# Patient Record
Sex: Female | Born: 1990 | Race: White | Hispanic: No | Marital: Single | Smoking: Never smoker
Health system: Southern US, Community
[De-identification: ages and names within clinical notes are randomized; demographics above are authoritative.]

## PROBLEM LIST (undated history)

## (undated) DIAGNOSIS — I499 Cardiac arrhythmia, unspecified: Secondary | ICD-10-CM

## (undated) HISTORY — PX: LEEP: SHX91

## (undated) HISTORY — PX: FRACTURE SURGERY: SHX138

---
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
## 2009-01-05 IMAGING — CR DG NECK SOFT TISSUE
1 series · 1 of 1 positions shown · non-contrast
Comparison: none

CLINICAL DATA: Neck pain

NECK SOFT TISSUES - 1  VIEW:

[w soft tissue neck *]
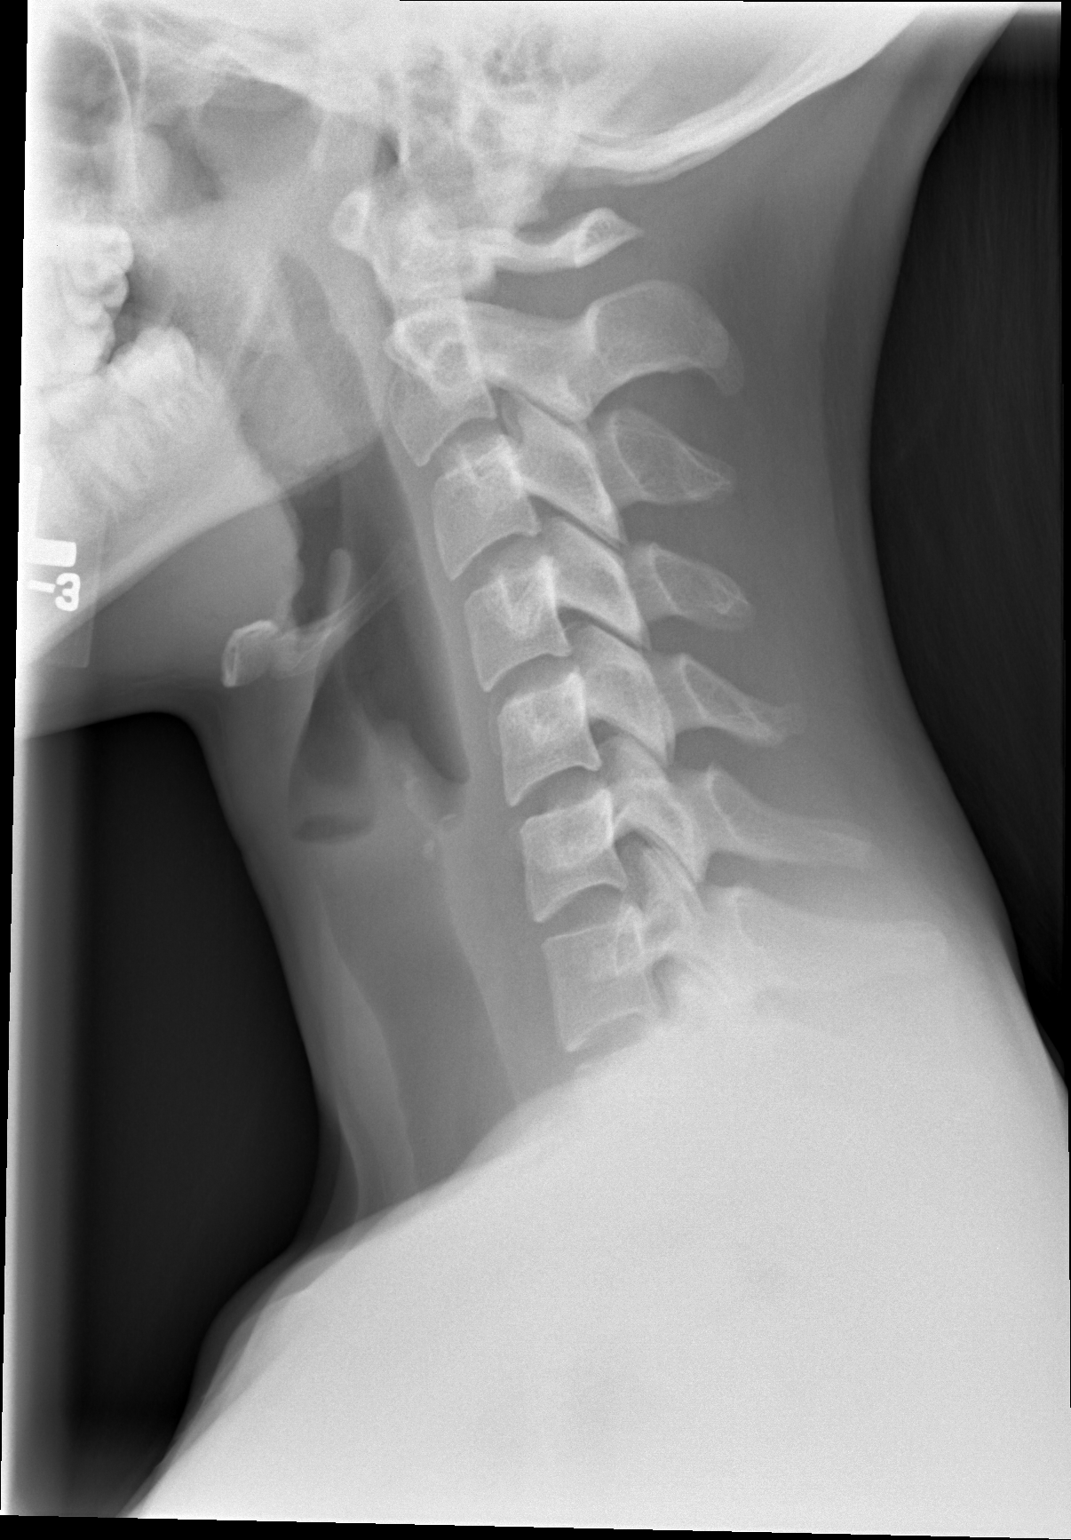

[1 of 1 positions shown; findings below may reference images not displayed]

FINDINGS: There is no evidence of retropharyngeal soft tissue swelling or
epiglottic enlargement.  The cervical airway is unremarkable, and no
radio-opaque foreign body identified. Visualized bony structures unremarkable.
IMPRESSION: Negative.

## 2014-01-23 ENCOUNTER — Ambulatory Visit (INDEPENDENT_AMBULATORY_CARE_PROVIDER_SITE_OTHER): Payer: BC Managed Care – PPO

## 2014-01-23 ENCOUNTER — Ambulatory Visit (INDEPENDENT_AMBULATORY_CARE_PROVIDER_SITE_OTHER): Payer: BC Managed Care – PPO | Admitting: Emergency Medicine

## 2014-01-23 VITALS — BP 112/78 | HR 65 | Temp 98.6°F | Resp 16 | Ht 67.75 in | Wt 126.8 lb

## 2014-01-23 DIAGNOSIS — M546 Pain in thoracic spine: Secondary | ICD-10-CM

## 2014-01-23 DIAGNOSIS — M542 Cervicalgia: Secondary | ICD-10-CM

## 2014-01-23 MED ORDER — CYCLOBENZAPRINE HCL 5 MG PO TABS: 5.0000 mg | ORAL_TABLET | Freq: Three times a day (TID) | ORAL | Status: DC | PRN

## 2014-01-23 MED ORDER — HYDROCODONE-ACETAMINOPHEN 5-325 MG PO TABS: ORAL_TABLET | ORAL | Status: DC

## 2014-01-23 NOTE — Patient Instructions (Signed)

## 2014-01-23 NOTE — Progress Notes (Signed)
   Subjective:    Patient ID: Sylvia Holden, female    DOB: 1990-05-25, 23 y.o.   MRN: [id number]  HPI patient has a long history of neck and back problems. She has not been to the doctor for these. Last Saturday she developed increasing pain in her neck and upper shoulders. She started on ibuprofen 800 mg every 6 hours last night but has had progressive worsening pain. She denies any paresthesias in the arms or legs and does not have any weakness. 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    Review of Systems     Objective:   Physical Exam  Constitutional:  Patient is sitting on exam table holding herself upright because of pain in her neck and upper back  HENT:  Head: Normocephalic and atraumatic.  Eyes: Pupils are equal, round, and reactive to light.  Neck: No JVD present. No tracheal deviation present. No thyromegaly present.  There is tenderness in the paracervical and upper perithoracic muscles   Cardiovascular: Normal rate and regular rhythm.   Pulmonary/Chest: Effort normal and breath sounds normal. No stridor.  Abdominal: Soft. Bowel sounds are normal.   [facility] reading (PRIMARY) by  Dr.Labrenda Lasky C-spine films show a vertical line through the odontoid consistent with a space in her upper incisors. There is no other acute abnormalities except for straightening of the C-spine. Chest x-ray shows no pneumothorax heart and lungs appear normal. T-spine films show a mild scoliotic curve but otherwise unremarkable  Meds ordered this encounter  Medications  . etonogestrel-ethinyl estradiol (NUVARING) 0.12-0.015 MG/24HR vaginal ring    Sig: Place 1 each vaginally every 28 (twenty-eight) days. Insert vaginally and leave in place for 3 consecutive weeks, then remove for 1 week.  . cyclobenzaprine (FLEXERIL) 5 MG tablet    Sig: Take 1 tablet (5 mg total) by mouth 3 (three) times daily as needed for muscle spasms.    Dispense:  30 tablet    Refill:  1  .  HYDROcodone-acetaminophen (NORCO) 5-325 MG per tablet    Sig: One half to one tablet every 6-8 hours as needed for severe pain    Dispense:  16 tablet    Refill:  0        Assessment & Plan:  She will continue over-the-counter ibuprofen. She was given Flexeril and a limited supply of hydrocodone. She will also start some heat and massage to the area

## 2014-10-20 LAB — OB RESULTS CONSOLE HEPATITIS B SURFACE ANTIGEN: Hepatitis B Surface Ag: NEGATIVE

## 2014-10-20 LAB — OB RESULTS CONSOLE GC/CHLAMYDIA
CHLAMYDIA, DNA PROBE: NEGATIVE
Gonorrhea: NEGATIVE

## 2014-10-20 LAB — OB RESULTS CONSOLE ANTIBODY SCREEN: ANTIBODY SCREEN: NEGATIVE

## 2014-10-20 LAB — OB RESULTS CONSOLE RPR: RPR: NONREACTIVE

## 2014-10-20 LAB — OB RESULTS CONSOLE HIV ANTIBODY (ROUTINE TESTING): HIV: NONREACTIVE

## 2014-10-20 LAB — OB RESULTS CONSOLE RUBELLA ANTIBODY, IGM: Rubella: IMMUNE

## 2015-05-10 LAB — OB RESULTS CONSOLE GBS: GBS: NEGATIVE

## 2015-05-18 ENCOUNTER — Encounter (HOSPITAL_COMMUNITY): Payer: Self-pay | Admitting: *Deleted

## 2015-05-18 ENCOUNTER — Observation Stay (HOSPITAL_COMMUNITY)
Admission: EM | Admit: 2015-05-18 | Discharge: 2015-05-19 | Disposition: A | Discharge status: Home / Self Care | Payer: Medicaid Other | Attending: Cardiovascular Disease | Admitting: Cardiovascular Disease

## 2015-05-18 ENCOUNTER — Encounter: Payer: Self-pay | Admitting: Cardiovascular Disease

## 2015-05-18 DIAGNOSIS — Z3A38 38 weeks gestation of pregnancy: Secondary | ICD-10-CM | POA: Insufficient documentation

## 2015-05-18 DIAGNOSIS — I4729 Other ventricular tachycardia: Secondary | ICD-10-CM

## 2015-05-18 DIAGNOSIS — Z3490 Encounter for supervision of normal pregnancy, unspecified, unspecified trimester: Secondary | ICD-10-CM | POA: Diagnosis present

## 2015-05-18 DIAGNOSIS — O99413 Diseases of the circulatory system complicating pregnancy, third trimester: Principal | ICD-10-CM | POA: Insufficient documentation

## 2015-05-18 DIAGNOSIS — I472 Ventricular tachycardia: Secondary | ICD-10-CM | POA: Insufficient documentation

## 2015-05-18 DIAGNOSIS — R000 Tachycardia, unspecified: Secondary | ICD-10-CM

## 2015-05-18 DIAGNOSIS — R002 Palpitations: Secondary | ICD-10-CM | POA: Diagnosis present

## 2015-05-18 LAB — CBC WITH DIFFERENTIAL/PLATELET
Basophils Absolute: 0 10*3/uL (ref 0.0–0.1)
Basophils Relative: 0 %
EOS PCT: 0 %
Eosinophils Absolute: 0.1 10*3/uL (ref 0.0–0.7)
HCT: 42.3 % (ref 36.0–46.0)
HEMOGLOBIN: 14.1 g/dL (ref 12.0–15.0)
LYMPHS ABS: 3.3 10*3/uL (ref 0.7–4.0)
LYMPHS PCT: 15 %
MCH: 30.3 pg (ref 26.0–34.0)
MCHC: 33.3 g/dL (ref 30.0–36.0)
MCV: 91 fL (ref 78.0–100.0)
MONOS PCT: 7 %
Monocytes Absolute: 1.7 10*3/uL — ABNORMAL HIGH (ref 0.1–1.0)
NEUTROS PCT: 78 %
Neutro Abs: 17.4 10*3/uL — ABNORMAL HIGH (ref 1.7–7.7)
Platelets: 326 10*3/uL (ref 150–400)
RBC: 4.65 MIL/uL (ref 3.87–5.11)
RDW: 13.4 % (ref 11.5–15.5)
WBC: 22.5 10*3/uL — AB (ref 4.0–10.5)

## 2015-05-18 LAB — COMPREHENSIVE METABOLIC PANEL
ALT: 16 U/L (ref 14–54)
AST: 21 U/L (ref 15–41)
Albumin: 3.5 g/dL (ref 3.5–5.0)
Alkaline Phosphatase: 118 U/L (ref 38–126)
Anion gap: 12 (ref 5–15)
CHLORIDE: 106 mmol/L (ref 101–111)
CO2: 21 mmol/L — AB (ref 22–32)
CREATININE: 0.59 mg/dL (ref 0.44–1.00)
Calcium: 10.6 mg/dL — ABNORMAL HIGH (ref 8.9–10.3)
GFR calc Af Amer: >60 mL/min (ref >60)
Glucose, Bld: 86 mg/dL (ref 65–99)
POTASSIUM: 4.1 mmol/L (ref 3.5–5.1)
SODIUM: 139 mmol/L (ref 135–145)
Total Bilirubin: 0.4 mg/dL (ref 0.3–1.2)
Total Protein: 7.3 g/dL (ref 6.5–8.1)

## 2015-05-18 LAB — TSH: TSH: 1.981 u[IU]/mL (ref 0.350–4.500)

## 2015-05-18 LAB — MRSA PCR SCREENING: MRSA by PCR: NEGATIVE

## 2015-05-18 LAB — ABO/RH: ABO/RH(D): O POS

## 2015-05-18 LAB — MAGNESIUM: Magnesium: 1.7 mg/dL (ref 1.7–2.4)

## 2015-05-18 MED ORDER — ONDANSETRON HCL 4 MG/2ML IJ SOLN: 4.0000 mg | Freq: Four times a day (QID) | INTRAMUSCULAR | Status: DC | PRN

## 2015-05-18 MED ORDER — METOPROLOL TARTRATE 25 MG PO TABS
25.0000 mg | ORAL_TABLET | Freq: Two times a day (BID) | ORAL | Status: DC
  Administered 2015-05-18 – 2015-05-19 (×2): 25 mg via ORAL
  Filled 2015-05-18 (×2): qty 1

## 2015-05-18 MED ORDER — ADENOSINE 6 MG/2ML IV SOLN
INTRAVENOUS | Status: AC
Start: 2015-05-18 — End: 2015-05-18
  Administered 2015-05-18: 6 mg via INTRAVENOUS
  Filled 2015-05-18: qty 4

## 2015-05-18 MED ORDER — SODIUM CHLORIDE 0.9 % IV BOLUS (SEPSIS)
1000.0000 mL | Freq: Once | INTRAVENOUS | Status: AC
  Administered 2015-05-18: 1000 mL via INTRAVENOUS

## 2015-05-18 MED ORDER — METOPROLOL TARTRATE 1 MG/ML IV SOLN
INTRAVENOUS | Status: AC
Start: 2015-05-18 — End: 2015-05-18
  Administered 2015-05-18: 5 mg via INTRAVENOUS
  Filled 2015-05-18: qty 5

## 2015-05-18 MED ORDER — ACETAMINOPHEN 325 MG PO TABS
650.0000 mg | ORAL_TABLET | ORAL | Status: DC | PRN
  Administered 2015-05-19: 650 mg via ORAL
  Filled 2015-05-18: qty 2

## 2015-05-18 MED ORDER — METOPROLOL TARTRATE 1 MG/ML IV SOLN
5.0000 mg | Freq: Four times a day (QID) | INTRAVENOUS | Status: DC
  Administered 2015-05-18 (×3): 5 mg via INTRAVENOUS
  Filled 2015-05-18: qty 5

## 2015-05-18 MED ORDER — METOPROLOL TARTRATE 1 MG/ML IV SOLN
INTRAVENOUS | Status: AC
  Filled 2015-05-18: qty 5

## 2015-05-18 MED ORDER — PRENATAL MULTIVITAMIN CH
1.0000 | ORAL_TABLET | Freq: Every day | ORAL | Status: DC
  Filled 2015-05-18: qty 1

## 2015-05-18 MED ORDER — ADENOSINE 6 MG/2ML IV SOLN
6.0000 mg | Freq: Once | INTRAVENOUS | Status: AC
  Administered 2015-05-18: 6 mg via INTRAVENOUS

## 2015-05-18 NOTE — H&P (Signed)
CONSULT NOTE  Date: 05/18/2015               Patient Name:  Sylvia Holden MRN: [id number]  DOB: 1991/05/09 Age / Sex: 24 y.o., female        PCP: No primary care provider on file. Primary Cardiologist: Sylvia Holden            Referring Physician: Silverio Holden              Reason for Consult: SVT vs. VT           History of Present Illness: Patient is a 24 y.o. female with no significant jPMH , [redacted] weeks pregnant, who [facility] for evaluation of Weakness and palpitations. She was found to have a wide complex tacchycardia at 180 She was given Adenosine 6 mg iv with termination of the WCT.  She has never had any previous episodes.    Uncomplicated pregnancy so far - [redacted] weeks along   Medications: Outpatient medications:  (Not in a hospital admission)  Current medications: No current facility-administered medications for this encounter.   Current Outpatient Prescriptions  Medication Sig Dispense Refill  . Prenatal Vit-Fe Fumarate-FA (PRENATAL MULTIVITAMIN) TABS tablet Take 1 tablet by mouth daily at 12 noon.    . cyclobenzaprine (FLEXERIL) 5 MG tablet Take 1 tablet (5 mg total) by mouth 3 (three) times daily as needed for muscle spasms. (Patient not taking: Reported on 05/18/2015) 30 tablet 1  . HYDROcodone-acetaminophen (NORCO) 5-325 MG per tablet One half to one tablet every 6-8 hours as needed for severe pain (Patient not taking: Reported on 05/18/2015) 16 tablet 0     No Known Allergies   History reviewed. No pertinent past medical history.  Past Surgical History  Procedure Laterality Date  . Fracture surgery      Family History  Problem Relation Age of Onset  . Diabetes Sister   . Cancer Maternal Grandmother     Social History:  reports that she has never smoked. She does not have any smokeless tobacco history on file. She  reports that she drinks alcohol. She reports that she does not use illicit drugs.   Review of Systems: Constitutional:  denies fever, chills, diaphoresis, appetite change and fatigue.  HEENT: denies photophobia, eye pain, redness, hearing loss, ear pain, congestion, sore throat, rhinorrhea, sneezing, neck pain, neck stiffness and tinnitus.  Respiratory: denies SOB, DOE, cough, chest tightness, and wheezing.  Cardiovascular: denies chest pain, palpitations and leg swelling.  Gastrointestinal: denies nausea, vomiting, abdominal pain, diarrhea, constipation, blood in stool.  Genitourinary: denies dysuria, urgency, frequency, hematuria, flank pain and difficulty urinating.  Musculoskeletal: denies myalgias, back pain, joint swelling, arthralgias and gait problem.   Skin: denies pallor, rash and wound.  Neurological: denies dizziness, seizures, syncope, weakness, light-headedness, numbness and headaches.   Hematological: denies adenopathy, easy bruising, personal or family bleeding history.  Psychiatric/ Behavioral: denies suicidal ideation, mood changes, confusion, nervousness, sleep disturbance and agitation.    Physical Exam: BP 130/83 mmHg  Pulse 100  Resp 15  SpO2 100%  Wt Readings from Last 3 Encounters:  01/23/14 126 lb 12.8 oz (57.516 kg)    General: Vital signs reviewed and noted. Pregnant, anxious   Head: Normocephalic, atraumatic, sclera anicteric,   Neck: Supple. Negative for carotid bruits. No JVD   Lungs:  Clear bilaterally, no wheezes, rales, or rhonchi. Breathing is normal   Heart: RRR with S1 S2. No murmurs, rubs, or gallops ,  very tachycardic initially   Abdomen/ GI :  Pregnant   MSK: Strength and the appear normal for age.   Extremities: No clubbing or cyanosis. No edema. Distal pedal pulses are 2+ and equal   Neurologic: CN are grossly intact, No obvious motor or sensory defect. Alert and oriented X 3. Moves all  extremities spontaneously.  Psych: Responds to questions appropriately with a normal affect.     Lab results: Basic Metabolic Panel:  Last Labs      Recent Labs Lab 05/18/15 1650  NA 139  K 4.1  CL 106  CO2 21*  GLUCOSE 86  BUN <5*  CREATININE 0.59  CALCIUM 10.6*  MG 1.7      Liver Function Tests:  Last Labs      Recent Labs Lab 05/18/15 1650  AST 21  ALT 16  ALKPHOS 118  BILITOT 0.4  PROT 7.3  ALBUMIN 3.5      Last Labs     No results for input(s): LIPASE, AMYLASE in the last 168 hours.    Last Labs     No results for input(s): AMMONIA in the last 168 hours.    CBC:  Last Labs      Recent Labs Lab 05/18/15 1650  WBC 22.5*  NEUTROABS 17.4*  HGB 14.1  HCT 42.3  MCV 91.0  PLT 326      Cardiac Enzymes:  Last Labs     No results for input(s): CKTOTAL, CKMB, CKMBINDEX, TROPONINI in the last 168 hours.    BNP:  Last Labs     Invalid input(s): POCBNP    CBG:  Last Labs     No results for input(s): GLUCAP in the last 168 hours.    Coagulation Studies:  Recent Labs (last 2 labs)     No results for input(s): LABPROT, INR in the last 72 hours.     Other results:  Personal review of EKG shows :Wide complex tachycardia at 180, ? VT vs. SVT with abberant conduction   Imaging:    Imaging Results (Last 48 hours)    No results found.       Assessment & Plan:  1. Wide complex tachycardia: I've reviewed the ECGs with Sylvia Holden. Its unclear at present whether this is VT vs SVT. Responded to adenosine. Seems to be better with the initial low doses of metoprolol. Will observe overnight. Echo tomorrow  Continue metoprolol 25 mg BID - will start tonight. tsh is normal   2. Pregnancy: Sylvia Holden [facility] nurse ) has evaluated the baby and everything has checked out fine. I spoke with Dr. Sallye Holden who gave the [name] for metoprolol  Will do fetal monitoring Q 8 hours and a non stress  test tomorrow  3. Elevated WBC: She states that this has been elevated chronically . No recent infection,   Sylvia Holden., MD, [facility] 05/18/2015, 5:35 PM Office - [phone] Pager [phone]

## 2015-05-18 NOTE — ED Provider Notes (Signed)
CSN: [id number]     Arrival date & time 05/18/15  1628 History   First MD Initiated Contact with Patient 05/18/15 1644     Chief Complaint  Patient presents with  . Tachycardia   24 yo F who is [redacted] weeks pregnant in normal healthy pregnancy who presents w/palpitations. Pt says she began feeling as if she was going to have an anxiety attack around 11AM and felt a fast heartbeat. She also felt like she was going to pass out. This was a constant feeling for several hours prompting her to seek help. She denies CP, SOB, fever, chills, vaginal bleeding/discharge, contractions, abd pain, vision changes, HA, N/V, diarrhea, constipation, hematemesis, dysuria, hematuria, sick contacts, or recent travel. Of note, pt has had a few weeks of sinus congestion but has otherwise been healthy, seeing her Ob regularly.    (Consider location/radiation/quality/duration/timing/severity/associated sxs/prior Treatment) Patient is a 24 y.o. female presenting with palpitations.  Palpitations Palpitations quality:  Fast Onset quality:  Sudden Duration:  5 hours Timing:  Constant Progression:  Unchanged Chronicity:  New Relieved by:  Nothing Worsened by:  Nothing Associated symptoms: no chest pain, no dizziness, no nausea, no shortness of breath and no vomiting   Risk factors: no hyperthyroidism     History reviewed. No pertinent past medical history. Past Surgical History  Procedure Laterality Date  . Fracture surgery     Family History  Problem Relation Age of Onset  . Diabetes Sister   . Cancer Maternal Grandmother    Social History  Substance Use Topics  . Smoking status: Never Smoker   . Smokeless tobacco: None  . Alcohol Use: Yes     Comment: 2 drinks a week   OB History    Gravida Para Term Preterm AB TAB SAB Ectopic Multiple Living   1              Review of Systems  Constitutional: Negative for fever and chills.  Respiratory: Negative for shortness of breath.   Cardiovascular: Positive  for palpitations. Negative for chest pain and leg swelling.  Gastrointestinal: Negative for nausea, vomiting, abdominal pain, diarrhea, constipation and abdominal distention.  Genitourinary: Negative for dysuria, frequency, flank pain and decreased urine volume.  Neurological: Positive for light-headedness. Negative for dizziness, speech difficulty and headaches.  Psychiatric/Behavioral:       Anxious feeling  All other systems reviewed and are negative.     Allergies  Review of patient's allergies indicates no known allergies.  Home Medications   Prior to Admission medications   Medication Sig Start Date End Date Taking? Authorizing Provider  Prenatal Vit-Fe Fumarate-FA (PRENATAL MULTIVITAMIN) TABS tablet Take 1 tablet by mouth daily at 12 noon.   Yes Historical Provider, MD  cyclobenzaprine (FLEXERIL) 5 MG tablet Take 1 tablet (5 mg total) by mouth 3 (three) times daily as needed for muscle spasms. Patient not taking: Reported on 05/18/2015 01/23/14   Collene Gobble, MD  HYDROcodone-acetaminophen [facility]) 5-325 MG per tablet One half to one tablet every 6-8 hours as needed for severe pain Patient not taking: Reported on 05/18/2015 01/23/14   Collene Gobble, MD   BP 114/82 mmHg  Pulse 91  Temp(Src) 97.7 F (36.5 C) (Oral)  Resp 12  Ht  (1.727 m)  Wt 80.06 kg  BMI 26.84 kg/m2  SpO2 100% Physical Exam  Constitutional: She is oriented to person, place, and time. She appears well-developed and well-nourished. No distress.  HENT:  Head: Normocephalic and atraumatic.  Cardiovascular:  Regular rhythm, normal heart sounds and intact distal pulses.  Exam reveals no gallop and no friction rub.   No murmur heard. Tachy to the 150s  Pulmonary/Chest: Effort normal and breath sounds normal. No respiratory distress. She has no wheezes. She has no rales. She exhibits no tenderness.  Abdominal: Soft. Bowel sounds are normal. She exhibits no distension and no mass. There is no tenderness. There  is no rebound and no guarding.  Pregnant abdomen, no tenderness  Lymphadenopathy:    She has no cervical adenopathy.  Neurological: She is oriented to person, place, and time. No cranial nerve deficit. Coordination normal.  Skin: Skin is warm and dry. No rash noted. She is not diaphoretic. No erythema.  Nursing note and vitals reviewed.   ED Course  .Critical Care Performed by: Rachelle HoraSMITH, Malary Aylesworth Authorized by: Rachelle HoraSMITH, Mckenzy Salazar Total critical care time: 20 minutes Critical care time was exclusive of separately billable procedures and treating other patients. Critical care was necessary to treat or prevent imminent or life-threatening deterioration of the following conditions: Vtach. Critical care was time spent personally by me on the following activities: discussions with consultants, examination of patient, ordering and performing treatments and interventions, ordering and review of radiographic studies, re-evaluation of patient's condition, ordering and review of laboratory studies and evaluation of patient's response to treatment. Subsequent provider of critical care: I assumed direction of critical care for this patient from another provider of my specialty.   (including critical care time) Labs Review Labs Reviewed  COMPREHENSIVE METABOLIC PANEL - Abnormal; Notable for the following:    CO2 21 (*)    BUN <5 (*)    Calcium 10.6 (*)    All other components within normal limits  CBC WITH DIFFERENTIAL/PLATELET - Abnormal; Notable for the following:    WBC 22.5 (*)    Neutro Abs 17.4 (*)    Monocytes Absolute 1.7 (*)    All other components within normal limits  MRSA PCR SCREENING  MAGNESIUM  TSH  URINALYSIS, ROUTINE W REFLEX MICROSCOPIC (NOT AT [facility])  BASIC METABOLIC PANEL  CBC  ABO/RH    Imaging Review No results found. I have personally reviewed and evaluated these images and lab results as part of my medical decision-making.   EKG Interpretation None      ED ECG REPORT    Date: 05/18/2015  Rate: 184   Rhythm: ventricular tachycardia  QRS Axis: normal  Intervals: wide VTach  ST/T Wave abnormalities: indeterminate  Conduction Disutrbances:Vtach  Narrative Interpretation:   Old EKG Reviewed: none available  I have personally reviewed the EKG tracing and agree with the computerized printout as noted.   MDM   Final diagnoses:  Wide Complex Ventricular Tachycardia   24 yo F who is [redacted] weeks pregnant who presents for Vtach. See HPI for details. On exam, NAD, AF. Wide tachycardia in the 180s. Fetal HR in the 160s and 170s. PE benign. Cards consulted. 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 Could also be SVT w/abberancy or true Vtach.   Given 6mg  adenosine w/conversion to NSR. Pt then had a few jumps back into Vtach sustained for a few seconds, but then back into NSR. Given metoprolol.  Pt has remained well after >1 hour after resolved Vtach. Admit to cards w/Ob following.    Pt was seen under the supervision of Dr. Silverio LayYao.     Rachelle HoraKeri Breezie Micucci, MD 05/18/15 [id number]  Richardean Canalavid H Yao, MD 05/18/15 [phone]

## 2015-05-18 NOTE — ED Notes (Signed)
MD at bedside. 

## 2015-05-18 NOTE — ED Notes (Signed)
Pt reports feeling her heart racing at home today.

## 2015-05-18 NOTE — ED Notes (Signed)
This note also relates to the following rows which could not be included: Pulse Rate - Cannot attach notes to unvalidated device data Resp - Cannot attach notes to unvalidated device data SpO2 - Cannot attach notes to unvalidated device data   Dr Sallye OberKulwa and the cardiologist consulted about the pt; pt to be observed overnight  and fetal monitoring every 8 hrs

## 2015-05-18 NOTE — Consult Note (Addendum)
CONSULT NOTE  Date: 05/18/2015               Patient Name:  Sylvia Holden MRN: [id number]  DOB: 1991/02/19 Age / Sex: 24 y.o., female        PCP: No primary care provider on file. Primary Cardiologist: New/Nahser            Referring Physician: Silverio Lay              Reason for Consult: SVT vs. VT           History of Present Illness: Patient is a 23 y.o. female with no significant jPMH , [redacted] weeks pregnant,   who was admitted to [facility] on 05/18/2015 for evaluation of  Weakness and palpitations. She was found to have a wide complex tacchycardia at 180 She was given Adenosine 6 mg iv with termination of the WCT.  She has never had any previous episodes.     Uncomplicated pregnancy so far - [redacted] weeks along   Medications: Outpatient medications:  (Not in a hospital admission)  Current medications: No current facility-administered medications for this encounter.   Current Outpatient Prescriptions  Medication Sig Dispense Refill  . Prenatal Vit-Fe Fumarate-FA (PRENATAL MULTIVITAMIN) TABS tablet Take 1 tablet by mouth daily at 12 noon.    . cyclobenzaprine (FLEXERIL) 5 MG tablet Take 1 tablet (5 mg total) by mouth 3 (three) times daily as needed for muscle spasms. (Patient not taking: Reported on 05/18/2015) 30 tablet 1  . HYDROcodone-acetaminophen (NORCO) 5-325 MG per tablet One half to one tablet every 6-8 hours as needed for severe pain (Patient not taking: Reported on 05/18/2015) 16 tablet 0     No Known Allergies   History reviewed. No pertinent past medical history.  Past Surgical History  Procedure Laterality Date  . Fracture surgery      Family History  Problem Relation Age of Onset  . Diabetes Sister   . Cancer Maternal Grandmother     Social History:  reports that she has never smoked. She does not have any smokeless tobacco history on file. She reports that she drinks alcohol. She reports that she does not use illicit drugs.   Review of  Systems: Constitutional:  denies fever, chills, diaphoresis, appetite change and fatigue.  HEENT: denies photophobia, eye pain, redness, hearing loss, ear pain, congestion, sore throat, rhinorrhea, sneezing, neck pain, neck stiffness and tinnitus.  Respiratory: denies SOB, DOE, cough, chest tightness, and wheezing.  Cardiovascular: denies chest pain, palpitations and leg swelling.  Gastrointestinal: denies nausea, vomiting, abdominal pain, diarrhea, constipation, blood in stool.  Genitourinary: denies dysuria, urgency, frequency, hematuria, flank pain and difficulty urinating.  Musculoskeletal: denies  myalgias, back pain, joint swelling, arthralgias and gait problem.   Skin: denies pallor, rash and wound.  Neurological: denies dizziness, seizures, syncope, weakness, light-headedness, numbness and headaches.   Hematological: denies adenopathy, easy bruising, personal or family bleeding history.  Psychiatric/ Behavioral: denies suicidal ideation, mood changes, confusion, nervousness, sleep disturbance and agitation.    Physical Exam: BP 130/83 mmHg  Pulse 100  Resp 15  SpO2 100%  Wt Readings from Last 3 Encounters:  01/23/14 126 lb 12.8 oz (57.516 kg)    General: Vital signs reviewed and noted. Pregnant,  anxious   Head: Normocephalic, atraumatic, sclera anicteric,   Neck: Supple. Negative for carotid bruits. No JVD   Lungs:  Clear bilaterally, no  wheezes, rales, or rhonchi. Breathing is normal   Heart: RRR with S1  S2. No murmurs, rubs, or gallops , very tachycardic initially   Abdomen/ GI :  Pregnant   MSK: Strength and the appear normal for age.   Extremities: No clubbing or cyanosis. No edema.  Distal pedal pulses are 2+ and equal   Neurologic:  CN are grossly intact,  No obvious motor or sensory defect.  Alert and oriented X 3. Moves all extremities spontaneously.  Psych: Responds to questions appropriately with a normal affect.     Lab results: Basic Metabolic  Panel:  Recent Labs Lab 05/18/15 1650  NA 139  K 4.1  CL 106  CO2 21*  GLUCOSE 86  BUN <5*  CREATININE 0.59  CALCIUM 10.6*  MG 1.7    Liver Function Tests:  Recent Labs Lab 05/18/15 1650  AST 21  ALT 16  ALKPHOS 118  BILITOT 0.4  PROT 7.3  ALBUMIN 3.5   No results for input(s): LIPASE, AMYLASE in the last 168 hours. No results for input(s): AMMONIA in the last 168 hours.  CBC:  Recent Labs Lab 05/18/15 1650  WBC 22.5*  NEUTROABS 17.4*  HGB 14.1  HCT 42.3  MCV 91.0  PLT 326    Cardiac Enzymes: No results for input(s): CKTOTAL, CKMB, CKMBINDEX, TROPONINI in the last 168 hours.  BNP: Invalid input(s): POCBNP  CBG: No results for input(s): GLUCAP in the last 168 hours.  Coagulation Studies: No results for input(s): LABPROT, INR in the last 72 hours.   Other results:  Personal review of EKG shows :Wide complex tachycardia at 180,   ? VT vs. SVT with abberant conduction   Imaging:  No results found.     Assessment & Plan:  1.  Wide complex tachycardia:   I've reviewed the ECGs with Dr. Graciela HusbandsKlein.  Its unclear at present whether this is VT vs SVT. Responded to adenosine. Seems to be better with the initial low doses of metoprolol. Will observe overnight. Echo tomorrow  Continue metoprolol 25 mg BID - will start tonight. tsh is normal   2. Pregnancy:   Sandrea HughsKatie Forsell [facility]( OB nurse ) has evaluated the baby and everything has checked out fine. I spoke with Dr[facility]ulwa [facility]  Will do fetal monitoring Q 8 hours and a non stress test tomorrow  3.  Elevated WBC:   She states that this has been elevated chronically . No recent infection,   Alvia GrovePhilip J. Nahser, Jr., MD, [facility] 05/18/2015, 5:35 PM Office - [phone] Pager [phone]

## 2015-05-19 ENCOUNTER — Other Ambulatory Visit: Payer: Self-pay

## 2015-05-19 ENCOUNTER — Observation Stay (HOSPITAL_BASED_OUTPATIENT_CLINIC_OR_DEPARTMENT_OTHER): Payer: Medicaid Other

## 2015-05-19 DIAGNOSIS — O99413 Diseases of the circulatory system complicating pregnancy, third trimester: Secondary | ICD-10-CM | POA: Diagnosis not present

## 2015-05-19 DIAGNOSIS — R079 Chest pain, unspecified: Secondary | ICD-10-CM

## 2015-05-19 DIAGNOSIS — I472 Ventricular tachycardia: Secondary | ICD-10-CM | POA: Diagnosis not present

## 2015-05-19 DIAGNOSIS — Z3A38 38 weeks gestation of pregnancy: Secondary | ICD-10-CM | POA: Diagnosis not present

## 2015-05-19 DIAGNOSIS — Z3490 Encounter for supervision of normal pregnancy, unspecified, unspecified trimester: Secondary | ICD-10-CM | POA: Diagnosis present

## 2015-05-19 LAB — BASIC METABOLIC PANEL
Anion gap: 8 (ref 5–15)
BUN: <5 mg/dL — ABNORMAL LOW (ref 6–20)
CHLORIDE: 109 mmol/L (ref 101–111)
CO2: 22 mmol/L (ref 22–32)
CREATININE: 0.45 mg/dL (ref 0.44–1.00)
Calcium: 8.7 mg/dL — ABNORMAL LOW (ref 8.9–10.3)
Glucose, Bld: 92 mg/dL (ref 65–99)
POTASSIUM: 3.8 mmol/L (ref 3.5–5.1)
SODIUM: 139 mmol/L (ref 135–145)

## 2015-05-19 LAB — CBC
HEMATOCRIT: 35.8 % — AB (ref 36.0–46.0)
Hemoglobin: 11.6 g/dL — ABNORMAL LOW (ref 12.0–15.0)
MCH: 29.6 pg (ref 26.0–34.0)
MCHC: 32.4 g/dL (ref 30.0–36.0)
MCV: 91.3 fL (ref 78.0–100.0)
PLATELETS: 270 10*3/uL (ref 150–400)
RBC: 3.92 MIL/uL (ref 3.87–5.11)
RDW: 13.7 % (ref 11.5–15.5)
WBC: 14.5 10*3/uL — AB (ref 4.0–10.5)

## 2015-05-19 LAB — URINALYSIS, ROUTINE W REFLEX MICROSCOPIC
Bilirubin Urine: NEGATIVE
GLUCOSE, UA: NEGATIVE mg/dL
Hgb urine dipstick: NEGATIVE
Ketones, ur: NEGATIVE mg/dL
LEUKOCYTES UA: NEGATIVE
Nitrite: NEGATIVE
PROTEIN: NEGATIVE mg/dL
Specific Gravity, Urine: 1.012 (ref 1.005–1.030)
pH: 7 (ref 5.0–8.0)

## 2015-05-19 MED ORDER — METOPROLOL TARTRATE 1 MG/ML IV SOLN: 5.0000 mg | Freq: Four times a day (QID) | INTRAVENOUS | Status: DC | PRN

## 2015-05-19 MED ORDER — ACETAMINOPHEN 325 MG PO TABS: 650.0000 mg | ORAL_TABLET | ORAL | Status: DC | PRN

## 2015-05-19 MED ORDER — METOPROLOL TARTRATE 25 MG PO TABS: 25.0000 mg | ORAL_TABLET | Freq: Two times a day (BID) | ORAL | Status: DC

## 2015-05-19 NOTE — Progress Notes (Signed)
Fetal Monitoring in process by Olegario MessierKathy, RN from [facility]. Will continue to monitor patient.

## 2015-05-19 NOTE — Progress Notes (Addendum)
1000 Patient resting comfortably upon my arrival.  Echocardiogram just finishing now.  Patient expressing questions and concerns regarding plans for delivery.  Patient reassured that I would be discussing with her OB MDs so that she can be informed.  Reports that she has been planning a water birth up to this point.  Patient is reassured.  NST started. Patient reports occasional cramps during the night but no regular or painful contractions.  She is feeling the baby move and denies bleeding or leaking of fluid from vagina.  1030  FHR Category I with occasional irregular contractions and frequent uterine irritability.  No signs of active labor at this time.  [id number]  Spoke with Dr. Su Hiltoberts.  She states that there is no current plan for induction of labor.  She encourages that patient call the office today to make an appointment for early next week.

## 2015-05-19 NOTE — Progress Notes (Signed)
Reviewed AVS with patient and significant other using teachback method. Patient denies questions or complaints at this time. Pt discharged to home via private vehicle with belongings including d/c paperwork, clothes, and shoes.

## 2015-05-19 NOTE — Discharge Summary (Signed)
Patient ID: Sylvia [name],  MRN: [id number], DOB/AGE: 09/20/1990 24 y.o.  Admit date: 05/18/2015 Discharge date: 05/19/2015  Primary Care Provider: No primary care provider on file. Primary Cardiologist: Dr Elease HashimotoNahser  Discharge Diagnoses Principal Problem:   NSVT (nonsustained ventricular tachycardia) [facility](HCC) Active Problems:   Pregnancy    Procedures: Echo cardiogram 05/19/15   Hospital Course:  24 y.o. female with no significant jPMH , [redacted] weeks pregnant, who [facility] for evaluation ofweakness and palpitations. She was found to have a wide complex tacchycardia at 180 She was given Adenosine 6 mg iv with termination of the WCT. Beta blocker was added and she was admitted for observation. She was seen by Sandrea HughsKatie Forsell ( OB nurse )and evaluated the baby and everything checked out fine. Dr Elease HashimotoNahser spoke with Dr. Sallye OberKulwa who gave the [facility] for metoprolol. Dr Elease HashimotoNahser reviewed the ECGs with Dr. Graciela HusbandsKlein.Initially it was unclear whether this was VT vs SVT but on further review, and after signal averaged EKG was obtained, Dr Graciela HusbandsKlein felt it was in fact VT.  Echo showed normal LVF. She was well controlled on Metoprolol and Dr Elease HashimotoNahser felt she could be discharged.  He would like to see her a few week post partum. At that time he may arrange for a cardiac MRI to be done.   Discharge Vitals:  Blood pressure 126/86, pulse 78, temperature 97.8 F (36.6 C), temperature source Oral, resp. rate 19, height 5\' 8"  (1.727 m), weight 179 lb 10.8 oz (81.5 kg), SpO2 100 %.    Labs: Results for orders placed or performed during the hospital encounter of 05/18/15 (from the past 24 hour(s))  Comprehensive metabolic panel     Status: Abnormal   Collection Time: 05/18/15  4:50 PM  Result Value Ref Range   Sodium 139 135 - 145 mmol/L   Potassium 4.1 3.5 - 5.1 mmol/L   Chloride 106 101 - 111 mmol/L   CO2 21 (L) 22 - 32 mmol/L   Glucose, Bld 86 65 - 99 mg/dL   BUN <5 (L) 6 - 20 mg/dL   Creatinine, Ser 1.910.59 0.44 - 1.00 mg/dL   Calcium 47.810.6 (H) 8.9 - 10.3 mg/dL   Total Protein 7.3 6.5 - 8.1 g/dL   Albumin 3.5 3.5 - 5.0 g/dL   AST 21 15 - 41 U/L   ALT 16 14 - 54 U/L   Alkaline Phosphatase 118 38 - 126 U/L   Total Bilirubin 0.4 0.3 - 1.2 mg/dL   GFR calc non Af Amer >60 >60 mL/min   GFR calc Af Amer >60 >60 mL/min   Anion gap 12 5 - 15  CBC with Differential     Status: Abnormal   Collection Time: 05/18/15  4:50 PM  Result Value Ref Range   WBC 22.5 (H) 4.0 - 10.5 K/uL   RBC 4.65 3.87 - 5.11 MIL/uL   Hemoglobin 14.1 12.0 - 15.0 g/dL   HCT 29.542.3 62.136.0 - 30.846.0 %   MCV 91.0 78.0 - 100.0 fL   MCH 30.3 26.0 - 34.0 pg   MCHC 33.3 30.0 - 36.0 g/dL   RDW 65.713.4 84.611.5 - 96.215.5 %   Platelets 326 150 - 400 K/uL   Neutrophils Relative % 78 %   Neutro Abs 17.4 (H) 1.7 - 7.7 K/uL   Lymphocytes Relative 15 %   Lymphs Abs 3.3 0.7 - 4.0 K/uL   Monocytes Relative 7 %   Monocytes Absolute 1.7 (H) 0.1 - 1.0  K/uL   Eosinophils Relative 0 %   Eosinophils Absolute 0.1 0.0 - 0.7 K/uL   Basophils Relative 0 %   Basophils Absolute 0.0 0.0 - 0.1 K/uL  Magnesium     Status: None   Collection Time: 05/18/15  4:50 PM  Result Value Ref Range   Magnesium 1.7 1.7 - 2.4 mg/dL  TSH     Status: None   Collection Time: 05/18/15  4:50 PM  Result Value Ref Range   TSH 1.981 0.350 - 4.500 uIU/mL  ABO/Rh     Status: None   Collection Time: 05/18/15  4:55 PM  Result Value Ref Range   ABO/RH(D) O POS   MRSA PCR Screening     Status: None   Collection Time: 05/18/15  7:52 PM  Result Value Ref Range   MRSA by PCR NEGATIVE NEGATIVE  Urinalysis, Routine w reflex microscopic (not at [facility])     Status: None   Collection Time: 05/19/15  1:25 AM  Result Value Ref Range   Color, Urine YELLOW YELLOW   APPearance CLEAR CLEAR   Specific Gravity, Urine 1.012 1.005 - 1.030   pH 7.0 5.0 - 8.0   Glucose, UA NEGATIVE NEGATIVE mg/dL   Hgb urine dipstick NEGATIVE NEGATIVE   Bilirubin Urine NEGATIVE NEGATIVE    Ketones, ur NEGATIVE NEGATIVE mg/dL   Protein, ur NEGATIVE NEGATIVE mg/dL   Nitrite NEGATIVE NEGATIVE   Leukocytes, UA NEGATIVE NEGATIVE  Basic metabolic panel     Status: Abnormal   Collection Time: 05/19/15  5:18 AM  Result Value Ref Range   Sodium 139 135 - 145 mmol/L   Potassium 3.8 3.5 - 5.1 mmol/L   Chloride 109 101 - 111 mmol/L   CO2 22 22 - 32 mmol/L   Glucose, Bld 92 65 - 99 mg/dL   BUN <5 (L) 6 - 20 mg/dL   Creatinine, Ser 1.61 0.44 - 1.00 mg/dL   Calcium 8.7 (L) 8.9 - 10.3 mg/dL   GFR calc non Af Amer >60 >60 mL/min   GFR calc Af Amer >60 >60 mL/min   Anion gap 8 5 - 15  CBC     Status: Abnormal   Collection Time: 05/19/15  5:18 AM  Result Value Ref Range   WBC 14.5 (H) 4.0 - 10.5 K/uL   RBC 3.92 3.87 - 5.11 MIL/uL   Hemoglobin 11.6 (L) 12.0 - 15.0 g/dL   HCT 09.6 (L) 04.5 - 40.9 %   MCV 91.3 78.0 - 100.0 fL   MCH 29.6 26.0 - 34.0 pg   MCHC 32.4 30.0 - 36.0 g/dL   RDW 81.1 91.4 - 78.2 %   Platelets 270 150 - 400 K/uL    Disposition:      Follow-up Information    Follow up with Sherryl Manges, MD On 06/01/2015.   Specialty:  Cardiology   Why:  at 2:30PM   Contact information:   [address] [place] [address] [phone]       Follow up with Nahser, Deloris Ping, MD On 06/19/2015.   Specialty:  Cardiology   Why:  10:45   Contact information:   [address] [place] [address] [phone]       Discharge Medications:    Medication List    STOP taking these medications        cyclobenzaprine 5 MG tablet  Commonly known as:  FLEXERIL     HYDROcodone-acetaminophen 5-325 MG tablet  Commonly known as:  NORCO      TAKE these medications        acetaminophen 325 MG tablet  Commonly known as:  TYLENOL  Take 2 tablets (650 mg total) by mouth every 4 (four) hours as needed for headache or mild pain.     metoprolol tartrate 25 MG tablet  Commonly known as:  LOPRESSOR  Take 1 tablet (25 mg total) by mouth 2  (two) times daily.     prenatal multivitamin Tabs tablet  Take 1 tablet by mouth daily at 12 noon.         Duration of Discharge Encounter: Greater than 30 minutes including physician time.  Jolene Provost PA-C 05/19/2015 2:17 PM

## 2015-05-19 NOTE — Progress Notes (Signed)
*  PRELIMINARY RESULTS* Echocardiogram 2D Echocardiogram has been performed.  Jeryl Columbialliott, Castella Lerner 05/19/2015, 10:02 AM

## 2015-05-19 NOTE — Progress Notes (Addendum)
PROGRESS NOTE  Subjective:   24 yo female,  Currently [redacted] weeks pregnant. Admitted with palpitations, lightheadedness and found to have wide complex tachycardia . Appears to be SVT to me. Responded to Adenosine and is responding well to metoprolol   Also could be VT Had a great night, no further episodes of tachycardia  Baby's heart HR is good.   Objective:    Vital Signs:   Temp:  [97.4 F (36.3 C)-98.7 F (37.1 C)] 98.2 F (36.8 C) (12/30 0345) Pulse Rate:  [66-100] 82 (12/30 0600) Resp:  [9-19] 17 (12/30 0600) BP: (110-130)/(71-92) 121/84 mmHg (12/30 0600) SpO2:  [96 %-100 %] 100 % (12/30 0600) Weight:  [176 lb 8 oz (80.06 kg)-180 lb 8 oz (81.874 kg)] 179 lb 10.8 oz (81.5 kg) (12/30 0600)  Last BM Date: 05/18/15   24-hour weight change: Weight change:   Weight trends: Filed Weights   05/18/15 1915 05/19/15 0345 05/19/15 0600  Weight: 176 lb 8 oz (80.06 kg) 180 lb 8 oz (81.874 kg) 179 lb 10.8 oz (81.5 kg)    Intake/Output:  12/29 0701 - 12/30 0700 In: 240 [P.O.:240] Out: -      Physical Exam: BP 121/84 mmHg  Pulse 82  Temp(Src) 98.2 F (36.8 C) (Oral)  Resp 17  Ht  (1.727 m)  Wt 179 lb 10.8 oz (81.5 kg)  BMI 27.33 kg/m2  SpO2 100%  Wt Readings from Last 3 Encounters:  05/19/15 179 lb 10.8 oz (81.5 kg)  01/23/14 126 lb 12.8 oz (57.516 kg)    General: Vital signs reviewed and noted.   Head: Normocephalic, atraumatic.  Eyes: conjunctivae/corneas clear.  EOM's intact.   Throat: normal  Neck:  normal   Lungs:    clear   Heart:  RR , no murmurs   Abdomen:  Pregnant     Extremities: No edema   Neurologic: A&O X3, CN II - XII are grossly intact.   Psych: Normal     Labs: BMET:  Recent Labs  05/18/15 1650 05/19/15 0518  NA 139 139  K 4.1 3.8  CL 106 109  CO2 21* 22  GLUCOSE 86 92  BUN <5* <5*  CREATININE 0.59 0.45  CALCIUM 10.6* 8.7*  MG 1.7  --     Liver function tests:  Recent Labs  05/18/15 1650  AST 21  ALT 16   ALKPHOS 118  BILITOT 0.4  PROT 7.3  ALBUMIN 3.5   No results for input(s): LIPASE, AMYLASE in the last 72 hours.  CBC:  Recent Labs  05/18/15 1650 05/19/15 0518  WBC 22.5* 14.5*  NEUTROABS 17.4*  --   HGB 14.1 11.6*  HCT 42.3 35.8*  MCV 91.0 91.3  PLT 326 270    Cardiac Enzymes: No results for input(s): CKTOTAL, CKMB, TROPONINI in the last 72 hours.  Coagulation Studies: No results for input(s): LABPROT, INR in the last 72 hours.  Other: Invalid input(s): POCBNP No results for input(s): DDIMER in the last 72 hours. No results for input(s): HGBA1C in the last 72 hours. No results for input(s): CHOL, HDL, LDLCALC, TRIG, CHOLHDL in the last 72 hours.  Recent Labs  05/18/15 1650  TSH 1.981   No results for input(s): VITAMINB12, FOLATE, FERRITIN, TIBC, IRON, RETICCTPCT in the last 72 hours.   Other results:  EKG  ( personally reviewed )  -NSR   Tele ,  NSR at 61  Medications:    Infusions:    Scheduled Medications: .  metoprolol tartrate  25 mg Oral BID  . prenatal multivitamin  1 tablet Oral Q1200    Assessment/ Plan:   Active Problems:   Wide-complex tachycardia (HCC)  1. Wide complex tacycardia:   C/w SVT Getting an echo today Also getting a signal average ECG Has responded very well to metoprolol  Will continue metoprolol 25 bid.   Changed the IV metoprolol to 5 mg Q 6 hr PRN. At this point , she seems very stable and should be fine to continue with her pregnancy.  There is some discussion from OB about inducing her today .  Will leave that decision up to them but she is currently stable from a cardiac standpoint   2. Pregnancy:   Fetal heart tones are stable. Due to be rechecked at 10 am  OB to see today  Disposition: possible DC today if echo and SAE are OK and if  OK with OB.    Length of Stay:   Alvia GrovePhilip J. Nahser, Jr., MD, [facility] 05/19/2015, 8:09 AM Office [phone] Pager [phone]   Addendum  The echo show low-normal LV  function .  ? Of inf. Hypokinesis The signal average ECG was read by Dr. Graciela HusbandsKlein -  It was read as normal ( but was rarely so)  Dr. Graciela HusbandsKlein thinks this is VT She is very well controlled on Metoprolol Will send her home on Metoprolol 25 BID She will need an appt. With me a few weeks after she delivers - which will probably be in about a month. Will arrange for her to have a cardiac MRI at that time.  [facility] Shawna OrleansMelanie has an appt with them on Tuesday     NahserDeloris Ping, Philip J, MD  05/19/2015 1:39 PM    [facility] [address],  [address] [place], [place]  [address] Pager [phone] Phone: [phone](336) [phone]; Fax: [phone]

## 2015-05-19 NOTE — Progress Notes (Signed)
Reactive NST--Baseline 155 bpm, multiple accelerations, no decelerations. Occasional contractions (pt states she felt one mild contraction). Category I tracing.

## 2015-05-21 NOTE — L&D Delivery Note (Signed)
Delivery Note 2258: Nurse call reports patient s/p epidural and c/o rectal pressure and urge to push.  In room to assess.  Infant noted at +4 station.  Room prepped for delivery and patient instructed on pushing methods.  Patient delivered, as below, with staff and husband support.   At 11:24 PM, on Jan. 4, 2016, a viable female "River Thurston Holenne" was delivered via Vaginal, Spontaneous Delivery (Presentation: Right Occiput Anterior).  Shoulders delivered easily and infant with good tone and spontaneous cry. Tactile stimulation given by provider and infant placed on mother's abdomen where nurse continued tactile stimulation. Infant APGAR: 9, 9. Cord clamped, cut, and blood collected. Placenta delivered spontaneously and noted to be intact with 3VC upon inspection.  Placenta to pathology for maternal SVT and infant SGA.  Vaginal inspection revealed a 2nd degree perineal and bilateral labial lacerations that was repaired with 3-0 vicryl on CT-1 and SH, respectively.  Additional anesthetic was necessary and ~4410mL of 1% lidocaine utilized.  Patient tolerated the procedure well. Bleeding noted to be moderate and manual exploration, of lower uterine segment, yielded ~22600mL of clots.  Fundus firm, above the symphysis pubis, and bleeding small.  Mother hemodynamically stable and infant skin to skin prior to provider exit.  Mother desires oral pill for birth control and opts to breastfeed.  Infant weight at one hour of life: 6lbs 0.7oz, 18.5in  Anesthesia: Epidural  Episiotomy: None Lacerations:  2nd Degree Perineal, Bilateral Labial Suture Repair: 3.0 vicryl Est. Blood Loss (mL):  400  Mom to remain on BS for continuous telemetry monitoring.  Baby to Couplet care / Skin to Skin.  Cherre RobinsJessica L Nevaan Bunton MSN, CNM 05/25/2015, 12:27 AM

## 2015-05-22 ENCOUNTER — Other Ambulatory Visit: Payer: Self-pay

## 2015-05-22 ENCOUNTER — Encounter (HOSPITAL_COMMUNITY): Payer: Self-pay | Admitting: *Deleted

## 2015-05-22 ENCOUNTER — Emergency Department (HOSPITAL_COMMUNITY)
Admission: EM | Admit: 2015-05-22 | Discharge: 2015-05-23 | Disposition: A | Discharge status: Home / Self Care | Payer: Medicaid Other | Source: Home / Self Care | Attending: Emergency Medicine | Admitting: Emergency Medicine

## 2015-05-22 ENCOUNTER — Telehealth: Payer: Self-pay | Admitting: Cardiology

## 2015-05-22 DIAGNOSIS — Z79899 Other long term (current) drug therapy: Secondary | ICD-10-CM

## 2015-05-22 DIAGNOSIS — R000 Tachycardia, unspecified: Secondary | ICD-10-CM | POA: Insufficient documentation

## 2015-05-22 DIAGNOSIS — Z3A38 38 weeks gestation of pregnancy: Secondary | ICD-10-CM

## 2015-05-22 DIAGNOSIS — R002 Palpitations: Secondary | ICD-10-CM

## 2015-05-22 DIAGNOSIS — O9989 Other specified diseases and conditions complicating pregnancy, childbirth and the puerperium: Secondary | ICD-10-CM

## 2015-05-22 DIAGNOSIS — R0789 Other chest pain: Secondary | ICD-10-CM

## 2015-05-22 NOTE — ED Notes (Signed)
Pt states that around 2050 she noticed her heart rate elevated. She tried to bare down and take deep breaths. She also took an additional half dose of metoprolol. States she was recently admitted and treated for elevated heart rate. Was placed on metoprolol. Pt is 38.[redacted] weeks pregnant.

## 2015-05-22 NOTE — Telephone Encounter (Signed)
Patient called tonight with palpitations.  She was coming home from work and started having palpitations and took metoprolol 25mg  at 9pm and then took 12.5mg  at 10:10pm. She says that all of a sudden the palpitations terminated.   Her heart rate is now 90bpm.  She is going to ER for an EKG to make sure she is in NSR.  Please have patient seen by Dr. Graciela HusbandsKlein 12/3.

## 2015-05-23 ENCOUNTER — Encounter (HOSPITAL_COMMUNITY): Payer: Self-pay | Admitting: *Deleted

## 2015-05-23 ENCOUNTER — Telehealth: Payer: Self-pay | Admitting: Cardiovascular Disease

## 2015-05-23 ENCOUNTER — Inpatient Hospital Stay (HOSPITAL_COMMUNITY)
Admission: AD | Admit: 2015-05-23 | Discharge: 2015-05-26 | DRG: 774 | Disposition: A | Discharge status: Home / Self Care | Payer: Medicaid Other | Source: Ambulatory Visit | Attending: Obstetrics and Gynecology | Admitting: Obstetrics and Gynecology

## 2015-05-23 ENCOUNTER — Telehealth: Payer: Self-pay | Admitting: Nurse Practitioner

## 2015-05-23 DIAGNOSIS — O365930 Maternal care for other known or suspected poor fetal growth, third trimester, not applicable or unspecified: Secondary | ICD-10-CM | POA: Diagnosis present

## 2015-05-23 DIAGNOSIS — F40298 Other specified phobia: Secondary | ICD-10-CM

## 2015-05-23 DIAGNOSIS — I471 Supraventricular tachycardia, unspecified: Secondary | ICD-10-CM | POA: Diagnosis present

## 2015-05-23 DIAGNOSIS — Z8739 Personal history of other diseases of the musculoskeletal system and connective tissue: Secondary | ICD-10-CM

## 2015-05-23 DIAGNOSIS — Z833 Family history of diabetes mellitus: Secondary | ICD-10-CM

## 2015-05-23 DIAGNOSIS — F410 Panic disorder [episodic paroxysmal anxiety] without agoraphobia: Secondary | ICD-10-CM

## 2015-05-23 DIAGNOSIS — Z8709 Personal history of other diseases of the respiratory system: Secondary | ICD-10-CM

## 2015-05-23 DIAGNOSIS — O9942 Diseases of the circulatory system complicating childbirth: Principal | ICD-10-CM | POA: Diagnosis present

## 2015-05-23 DIAGNOSIS — Z9889 Other specified postprocedural states: Secondary | ICD-10-CM

## 2015-05-23 DIAGNOSIS — Z3A38 38 weeks gestation of pregnancy: Secondary | ICD-10-CM | POA: Diagnosis not present

## 2015-05-23 DIAGNOSIS — Z8659 Personal history of other mental and behavioral disorders: Secondary | ICD-10-CM

## 2015-05-23 DIAGNOSIS — I472 Ventricular tachycardia: Secondary | ICD-10-CM | POA: Diagnosis present

## 2015-05-23 DIAGNOSIS — O3440 Maternal care for other abnormalities of cervix, unspecified trimester: Secondary | ICD-10-CM

## 2015-05-23 DIAGNOSIS — Z889 Allergy status to unspecified drugs, medicaments and biological substances status: Secondary | ICD-10-CM

## 2015-05-23 DIAGNOSIS — Z8669 Personal history of other diseases of the nervous system and sense organs: Secondary | ICD-10-CM

## 2015-05-23 DIAGNOSIS — I4729 Other ventricular tachycardia: Secondary | ICD-10-CM

## 2015-05-23 LAB — I-STAT CHEM 8, ED
BUN: 8 mg/dL (ref 6–20)
CHLORIDE: 103 mmol/L (ref 101–111)
CREATININE: 0.4 mg/dL — AB (ref 0.44–1.00)
Calcium, Ion: 1.18 mmol/L (ref 1.12–1.23)
Glucose, Bld: 81 mg/dL (ref 65–99)
HEMATOCRIT: 37 % (ref 36.0–46.0)
Hemoglobin: 12.6 g/dL (ref 12.0–15.0)
Potassium: 3.2 mmol/L — ABNORMAL LOW (ref 3.5–5.1)
Sodium: 141 mmol/L (ref 135–145)
TCO2: 27 mmol/L (ref 0–100)

## 2015-05-23 LAB — CBC
HEMATOCRIT: 37.3 % (ref 36.0–46.0)
Hemoglobin: 12.5 g/dL (ref 12.0–15.0)
MCH: 30.4 pg (ref 26.0–34.0)
MCHC: 33.5 g/dL (ref 30.0–36.0)
MCV: 90.8 fL (ref 78.0–100.0)
Platelets: 306 10*3/uL (ref 150–400)
RBC: 4.11 MIL/uL (ref 3.87–5.11)
RDW: 13.8 % (ref 11.5–15.5)
WBC: 15.5 10*3/uL — AB (ref 4.0–10.5)

## 2015-05-23 LAB — TYPE AND SCREEN
ABO/RH(D): O POS
Antibody Screen: NEGATIVE

## 2015-05-23 LAB — ABO/RH: ABO/RH(D): O POS

## 2015-05-23 MED ORDER — CITRIC ACID-SODIUM CITRATE 334-500 MG/5ML PO SOLN: 30.0000 mL | ORAL | Status: DC | PRN

## 2015-05-23 MED ORDER — OXYTOCIN BOLUS FROM INFUSION
500.0000 mL | INTRAVENOUS | Status: DC
Start: 2015-05-23 — End: 2015-05-24

## 2015-05-23 MED ORDER — LIDOCAINE HCL (PF) 1 % IJ SOLN: 30.0000 mL | INTRAMUSCULAR | Status: DC | PRN

## 2015-05-23 MED ORDER — ACETAMINOPHEN 325 MG PO TABS: 650.0000 mg | ORAL_TABLET | ORAL | Status: DC | PRN

## 2015-05-23 MED ORDER — POTASSIUM CHLORIDE CRYS ER 20 MEQ PO TBCR
40.0000 meq | EXTENDED_RELEASE_TABLET | Freq: Once | ORAL | Status: AC
  Administered 2015-05-23: 40 meq via ORAL
  Filled 2015-05-23: qty 2

## 2015-05-23 MED ORDER — METOPROLOL TARTRATE 25 MG PO TABS
37.5000 mg | ORAL_TABLET | Freq: Two times a day (BID) | ORAL | Status: DC
  Filled 2015-05-23: qty 1.5

## 2015-05-23 MED ORDER — OXYTOCIN 10 UNIT/ML IJ SOLN
2.5000 [IU]/h | INTRAVENOUS | Status: DC
  Filled 2015-05-23: qty 4

## 2015-05-23 MED ORDER — ONDANSETRON HCL 4 MG/2ML IJ SOLN
4.0000 mg | Freq: Four times a day (QID) | INTRAMUSCULAR | Status: DC | PRN
Start: 2015-05-23 — End: 2015-05-24

## 2015-05-23 MED ORDER — LACTATED RINGERS IV SOLN
INTRAVENOUS | Status: DC
  Administered 2015-05-23 – 2015-05-24 (×4): via INTRAVENOUS

## 2015-05-23 MED ORDER — METOPROLOL TARTRATE 25 MG PO TABS
37.5000 mg | ORAL_TABLET | Freq: Two times a day (BID) | ORAL | Status: DC
  Administered 2015-05-23 – 2015-05-26 (×6): 37.5 mg via ORAL
  Filled 2015-05-23: qty 1
  Filled 2015-05-23 (×3): qty 1.5
  Filled 2015-05-23: qty 1
  Filled 2015-05-23 (×3): qty 1.5

## 2015-05-23 MED ORDER — LACTATED RINGERS IV SOLN: 500.0000 mL | INTRAVENOUS | Status: DC | PRN

## 2015-05-23 MED ORDER — HYDROXYZINE HCL 50 MG PO TABS: 50.0000 mg | ORAL_TABLET | Freq: Four times a day (QID) | ORAL | Status: DC | PRN

## 2015-05-23 MED ORDER — OXYCODONE-ACETAMINOPHEN 5-325 MG PO TABS: 2.0000 | ORAL_TABLET | ORAL | Status: DC | PRN

## 2015-05-23 MED ORDER — FLEET ENEMA 7-19 GM/118ML RE ENEM: 1.0000 | ENEMA | RECTAL | Status: DC | PRN

## 2015-05-23 MED ORDER — NALBUPHINE HCL 10 MG/ML IJ SOLN
10.0000 mg | INTRAMUSCULAR | Status: DC | PRN
  Administered 2015-05-23: 10 mg via INTRAVENOUS
  Filled 2015-05-23: qty 1

## 2015-05-23 MED ORDER — METOPROLOL TARTRATE 25 MG PO TABS
12.5000 mg | ORAL_TABLET | Freq: Once | ORAL | Status: DC
  Filled 2015-05-23: qty 0.5

## 2015-05-23 MED ORDER — OXYCODONE-ACETAMINOPHEN 5-325 MG PO TABS: 1.0000 | ORAL_TABLET | ORAL | Status: DC | PRN

## 2015-05-23 NOTE — ED Notes (Signed)
Pt able to ambulate independently 

## 2015-05-23 NOTE — Telephone Encounter (Signed)
     Sylvia Holden has done well since having recurrent tachycardia ( VT vs. SVT)  We increased her metoprolol to 37.5 mg BID. She had her OB appt today. Since she is having recurrent tachycardia, I have recommended that she be induced if is deemed safe from an OB standpoint.   [facility]     Sollie Vultaggio, Deloris PingPhilip J, MD  05/23/2015 6:01 PM    [facility] [address],  [address] [place], [place]  [address] Pager [phone] Phone: [phone](336) [phone]; Fax: [phone]   [facility] Office  [address] [place], [place]  [address] [phone]   Fax [phone]) [phone]

## 2015-05-23 NOTE — Progress Notes (Signed)
Cardiac monitor applied. Patient reports not having any signs or symptoms of SVT today.  Currently heart rate is NSR, auscultation WNL of S1 and S2.  Patient took metoprolol 25 mg at 10 am this morning.

## 2015-05-23 NOTE — H&P (Signed)
Sylvia Holden is a 25 y.o. female, G1P0 at 38.5 weeks, presenting for IOL as recommended by Cardiology due to recurrent tachycardia (VT versus SVT). She endorses +FM. Denies SOB, chest tightness, palpitations, anxiety, leaking, bleeding or ctxs.  Of particular significance: Pt was admitted to [facility] on 05/18/2015 for evaluation ofweakness and palpitations. She was found to have a wide complex tachycardia at 180. She was given Adenosine 6 mg IV with termination of the WCT. Beta blocker was added and she was admitted for observation. Dr Elease Hashimoto spoke with Dr. [name] who gave the [facility] for Metoprolol. Dr Elease Hashimoto reviewed the ECGs with Dr. Graciela Husbands.Initially it was unclear whether this was VT vs SVT but on further review, and after signal averaged EKG was obtained, Dr Graciela Husbands felt it was in fact VT.Echo showed normal LVF. She was well controlled on Metoprolol and Dr Elease Hashimoto felt she could be discharged.He would like to see her a few weeks post partum. At that time he may arrange for a cardiac MRI to be done.   Patient Active Problem List   Diagnosis Date Noted  . H/O LEEP (loop electrosurgical excision procedure) of cervix complicating pregnancy (05/06/14) 05/23/2015  . Hx of migraines 05/23/2015  . History of chronic back pain 05/23/2015  . History of asthma 05/23/2015  . History of anxiety 05/23/2015  . Needle phobia 05/23/2015  . SGA (small for gestational age) 05/23/2015  . Panic attacks 05/23/2015  . H/O seasonal allergies 05/23/2015  . Pregnancy   . NSVT (nonsustained ventricular tachycardia) (HCC) 05/18/2015    History of present pregnancy: Patient entered care at 27.6 weeks, transfer in from [facility] due to insurance reasons.   EDC of 06/01/15 was established by LMP and that was c/w u/s at 6.4 wks.   Anatomy scan: 30.5 weeks, EFW 18%ile, NORMAL FLUID, VTX, CERVIX CLOSED/LONG. REVIEWED DATING BY 6 4/7 WEEK [place], C/W LMP DATING. WILL REPEAT US AT 34-36 WEEKS. Additional [place] evaluations:  17.5  wks (h/o LEEP procedure): Normal. 21.0 wks (h/o LEEP procedure): Cvx 3.3 cm, no funneling. 34.6 wks (growth): Vertex. Anterior placenta. AFI normal (30%). EFW 4+9 (+/- 11 oz) 24th%tile. HC, AC, FL lagging <10%.   Significant prenatal events: Normal 2nd & 3rd trimester pregnancy complaints. Exposed to coxsackie viral infection - no sxs. Began having palpitations around 36 wks. Had cardiology w/u. Admitted to [facility] on 29 Dec for evaluation of weakness and palpitations, was given Adenosine IV and placed on Metoprolol po bid. Desired Waterbirth delivery, but due to dx of recurrent tachycardia (VT versus SVT), she was no longer a candidate. H/O panic attacks and anxiety. Declined Tdap, flu and genetic testing. No MAU visits. Had left elbow surgery in 2013 - plates and screws placed. FOB w/ [place] ancestry. TWG 26.3 lbs, normal BMI.  Last evaluation: Office on 05/23/15 @ 38.5 wks by Dr. AVS. FHR 156 bpm. Cvx 2/50/-2. BP 110/68. Wt: 178 lbs. Trace LE edema. Recs received from Cardiology to induce pt due to persistent tachycardia.  OB History    Gravida Para Term Preterm AB TAB SAB Ectopic Multiple Living   1              History reviewed. No pertinent past medical history. Past Surgical History  Procedure Laterality Date  . Wisdom tooth extraction  2009  . Fracture surgery Left     arm   . Leep     Family History: family history includes Cancer in her maternal grandmother; Diabetes in her sister.Crohn's in  her PGF, RA in her maternal aunt, colon CA in her uncle. Social History:  reports that she has never smoked. She does not have any smokeless tobacco history on file. She reports that she drinks alcohol. She reports that she does not use illicit drugs. Patient is single, with fiance Ouida Sills(Andrew Levitrze) involved and supportive. She is Caucasian, has no religious preference, but will accept blood in an emergency. 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 She is a  Vegetarian.    Prenatal Transfer Tool  Maternal Diabetes: No Genetic Screening: Declined Maternal Ultrasounds/Referrals: Normal Fetal Ultrasounds or other Referrals:  None Maternal Substance Abuse:  No Significant Maternal Medications:  Meds include: Other: PNV, Metoprolol Significant Maternal Lab Results: Lab values include: Group B Strep negative  TDAP: Declined Flu: Declined  ROS: 10 Systems reviewed and are negative for acute change except as noted in the HPI.   No Known Allergies    Blood pressure 128/79, pulse 81, temperature 98.4 F (36.9 C), temperature source Oral, resp. rate 18, height 5\' 8"  (1.727 m), weight 80.287 kg (177 lb), SpO2 100 %.  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 LEEP path report--both low and high grade SIL. Pap 10/2014 WNL. Per previous practice, plan for repeat pap in 06/2015--OK'd per consult with Dr. Linward Nataloberts GC: Neg 10/20/14 Chlamydia: Neg 10/20/14 Genetic screenings: Declined Glucola: Normal at 72 Hgb 13.7 at NOB, 12.5 at 28 weeks   Assessment/Plan: IUP at 38.5 wks IOL due to recurrent tachycardia (VT versus SVT) SGA infant (EFW at 34.6 wks = 4+9 -- 24th%tile with lagging HC, AC, FL <10%).   Cat 1 FHRT GBS neg H/O LEEP procedure H/O panic attacks and anxiety Unfavorable cvx  [facility] consult with Cardiology (Dr.  Elease HashimotoNahser) and Dr. Normand Sloopillard. Routine [facility] orders. Reviewed R&B of induction with patient and FOB, including need for serial induction, risk of C/S, need for further intervention. Patient and FOB seem to understand these risks and are agreeable with proceeding.  Reviewed options of induction to include Cytotec, Foley bulb, AROM and Pitocin. In light of frequent ctxs, will place Foley Bulb. Cardiac monitoring per Cardiology. Pt to have 1:1 nursing. Metoprolol 37.5 mg bid per Cardiology, first dose tonight. Planning NCB, but may desire pain med/epidural if pain becomes too unbearable. Expect progress and SVD. Consult Attending (Dr. Richardson Doppole) prn.   Robyne AskewWILLIAMS, KIMBERLYCNM, MS 05/23/15, 11 PM

## 2015-05-23 NOTE — Discharge Instructions (Signed)

## 2015-05-23 NOTE — ED Notes (Signed)
Pt placed on toco and heart monitor

## 2015-05-23 NOTE — Telephone Encounter (Signed)
Received call from Dr. Elease HashimotoNahser who is rounding in the hospital today.  He states patient called into on call provider last night with c/o fast heart rate.  She reported that she took an extra 1/2 tab of 25 mg Metoprolol 1 hour after taking regular dose of 25 mg and was advised to go to the ER for an ecg to make certain she was in sinus rhythm.  I called the patient and she reports that ecg revealed NSR, she was given an extra potassium tablet, and discharged.  She states she has been asleep but has not had any problems since returning home from hospital.  She has an appointment with her OB at 3 today.  Dr. Elease HashimotoNahser advised that she increase Metoprolol dose to 37.5 mg BID; he advised she discuss with OB today.  I advised her to call me back after her appointment.  Dr. Elease HashimotoNahser advised that it is not necessary for patient to have appointment at our office today.  She verbalized understanding and agreement.

## 2015-05-23 NOTE — Progress Notes (Addendum)
After explaining procedure, an intracervical balloon was placed @ 20:40 PM w/ 60 ml fluid slowly injected. Traction applied on catheter. Pt. tolerated procedure well. She immediately began cramping and continued to cramp ~ 30 min post placement. She requested IV pain meds and 10 mg Nubain was given at 2122. Cat 1 FHRT remains w/ ctxs every 2-5 minutes.  Metoprolol 37.5 mg given at 2220. ECG shows NSR. Pt asymptomatic.  P: Await FB expulsion. Dr. Richardson Doppole updated on pt's current status and recs from Cardiologist.   Sherre ScarletKimberly Anaise Sterbenz, CNM 05/23/15, 11:55 PM

## 2015-05-23 NOTE — ED Provider Notes (Signed)
CSN: [id number]     Arrival date & time 05/22/15  2329 History  By signing my name below, I, Freida Busmaniana Omoyeni, attest that this documentation has been prepared under the direction and in the presence of Loren Raceravid Colden Samaras, MD . Electronically Signed: Freida Busmaniana Omoyeni, Scribe. 05/23/2015. 1:22 AM.    Chief Complaint  Patient presents with  . Tachycardia    The history is provided by the patient. No language interpreter was used.   HPI Comments:  Sylvia Holden is a 25 y.o. female ~[redacted] weeks pregnant, who presents to the Emergency Department complaining of palpitations onset ~ 2000 last night (05/22/15). She states her HR was ~ 150. She reports associated chest tightness. Pt was seen in the ED on 05/18/15 for the same. She notes her heart rate at that time was ~ 185 and [place] Pt was admitted to the hospital and discharged with Rx for metropolol.  She notes her symptom today was similar to last episode on 05/18/15. Pt states when episode began she took her dose of metropolol ~1 hour early, took deep breaths, and tried bearing down without relief. She then took another half dose of metropolol and called on-call cardiologist Dr. Pricilla Holmucker who advised her to return to the ED for an EKG. Pt note her symptoms resolved ~ 2200. She denies acute swelling in her BLE and SOB.  History reviewed. No pertinent past medical history. Past Surgical History  Procedure Laterality Date  . Wisdom tooth extraction  2009  . Fracture surgery Left     arm   . Leep     Family History  Problem Relation Age of Onset  . Diabetes Sister   . Cancer Maternal Grandmother    Social History  Substance Use Topics  . Smoking status: Never Smoker   . Smokeless tobacco: None  . Alcohol Use: Yes     Comment: 2 drinks a week   OB History as of 05/26/15    Gravida Para Term Preterm AB TAB SAB Ectopic Multiple Living   1 1 1       0 1     Review of Systems  Constitutional: Negative for fever and chills.   Respiratory: Positive for chest tightness. Negative for shortness of breath.   Cardiovascular: Positive for palpitations. Negative for chest pain.  Gastrointestinal: Negative for nausea, vomiting, abdominal pain and diarrhea.  Genitourinary: Negative for dysuria, flank pain, vaginal bleeding and pelvic pain.  Musculoskeletal: Negative for back pain and neck pain.  Skin: Negative for rash and wound.  Neurological: Negative for dizziness, weakness, light-headedness, numbness and headaches.  All other systems reviewed and are negative.   Allergies  Review of patient's allergies indicates no known allergies.  Home Medications   Prior to Admission medications   Medication Sig Start Date End Date Taking? Authorizing Provider  acetaminophen (TYLENOL) 325 MG tablet Take 2 tablets (650 mg total) by mouth every 4 (four) hours as needed for headache or mild pain. 05/19/15  Yes Abelino DerrickLuke K Kilroy, PA-C  Prenatal Vit-Fe Fumarate-FA (PRENATAL MULTIVITAMIN) TABS tablet Take 1 tablet by mouth daily at 12 noon.   Yes Historical Provider, MD  ibuprofen (ADVIL,MOTRIN) 600 MG tablet Take 1 tablet (600 mg total) by mouth every 6 (six) hours. 05/26/15   Venus Standard, CNM  metoprolol tartrate 37.5 MG TABS Take 37.5 mg by mouth 2 (two) times daily. 05/26/15   Venus Standard, CNM  senna-docusate (SENOKOT-S) 8.6-50 MG tablet Take 2 tablets by mouth at bedtime as  needed for mild constipation. 05/26/15   Venus Standard, CNM   BP 125/91 mmHg  Pulse 71  Temp(Src) 98.2 F (36.8 C)  Resp 15  Ht 5\' 8"  (1.727 m)  Wt 177 lb 11.2 oz (80.604 kg)  BMI 27.03 kg/m2  SpO2 99% Physical Exam  Constitutional: She is oriented to person, place, and time. She appears well-developed and well-nourished. No distress.  HENT:  Head: Normocephalic and atraumatic.  Mouth/Throat: Oropharynx is clear and moist.  Eyes: EOM are normal. Pupils are equal, round, and reactive to light.  Neck: Normal range of motion. Neck supple.   Cardiovascular: Normal rate and regular rhythm.  Exam reveals no gallop and no friction rub.   No murmur heard. Pulmonary/Chest: Effort normal and breath sounds normal. No respiratory distress. She has no wheezes. She has no rales. She exhibits no tenderness.  Abdominal: Soft. Bowel sounds are normal. She exhibits no distension and no mass. There is no tenderness. There is no rebound and no guarding.  Gravid  Musculoskeletal: Normal range of motion. She exhibits no edema or tenderness.  No calf swelling or tenderness.  Neurological: She is alert and oriented to person, place, and time.  Skin: Skin is warm and dry. No rash noted. No erythema.  Psychiatric: She has a normal mood and affect. Her behavior is normal.  Nursing note and vitals reviewed.   ED Course  Procedures   DIAGNOSTIC STUDIES:  Oxygen Saturation is 99% on RA, normal by my interpretation.    COORDINATION OF CARE:  12:33 AM Discussed treatment plan with pt at bedside and pt agreed to plan.  Labs Review Labs Reviewed  I-STAT CHEM 8, ED - Abnormal; Notable for the following:    Potassium 3.2 (*)    Creatinine, Ser 0.40 (*)    All other components within normal limits    Imaging Review No results found. I have personally reviewed and evaluated these lab results as part of my medical decision-making.   EKG Interpretation   Date/Time:  Monday May 22 2015 23:57:29 EST Ventricular Rate:  95 PR Interval:  114 QRS Duration: 82 QT Interval:  368 QTC Calculation: 462 R Axis:   89 Text Interpretation:  Normal sinus rhythm with sinus arrhythmia Right  atrial enlargement Borderline ECG Confirmed by Ranae Palms  MD, Richar Dunklee  ([id number]) on 05/23/2015 12:34:22 AM      MDM   Final diagnoses:  Intermittent palpitations   I personally performed the services described in this documentation, which was scribed in my presence. The recorded information has been reviewed and is accurate.    Patient is asymptomatic. Denies  any OB-related symptoms at any point. She was observed for 3 hours in the emergency department with no arrhythmias. Fetal monitoring without abnormality. Patient has follow-up with cardiology. Patient has previously been diagnosed with SVT versus VT. She is taking metoprolol for this. She understands the need to return immediately for any worsening palpitations lightheadedness, chest pain for any concerns.   Loren Racer, MD 05/27/15 [phone]

## 2015-05-24 ENCOUNTER — Inpatient Hospital Stay (HOSPITAL_COMMUNITY): Payer: Medicaid Other | Admitting: Anesthesiology

## 2015-05-24 LAB — COMPREHENSIVE METABOLIC PANEL
ALT: 14 U/L (ref 14–54)
AST: 18 U/L (ref 15–41)
Albumin: 3 g/dL — ABNORMAL LOW (ref 3.5–5.0)
Alkaline Phosphatase: 99 U/L (ref 38–126)
Anion gap: 9 (ref 5–15)
CHLORIDE: 111 mmol/L (ref 101–111)
CO2: 19 mmol/L — AB (ref 22–32)
Calcium: 8.8 mg/dL — ABNORMAL LOW (ref 8.9–10.3)
Creatinine, Ser: 0.43 mg/dL — ABNORMAL LOW (ref 0.44–1.00)
GFR calc Af Amer: >60 mL/min (ref >60)
GFR calc non Af Amer: >60 mL/min (ref >60)
GLUCOSE: 75 mg/dL (ref 65–99)
POTASSIUM: 3.8 mmol/L (ref 3.5–5.1)
Sodium: 139 mmol/L (ref 135–145)
Total Bilirubin: 0.7 mg/dL (ref 0.3–1.2)
Total Protein: 6.7 g/dL (ref 6.5–8.1)

## 2015-05-24 LAB — LACTATE DEHYDROGENASE: LDH: 126 U/L (ref 98–192)

## 2015-05-24 LAB — CBC
HCT: 35.6 % — ABNORMAL LOW (ref 36.0–46.0)
HEMOGLOBIN: 11.9 g/dL — AB (ref 12.0–15.0)
MCH: 30.6 pg (ref 26.0–34.0)
MCHC: 33.4 g/dL (ref 30.0–36.0)
MCV: 91.5 fL (ref 78.0–100.0)
Platelets: 262 10*3/uL (ref 150–400)
RBC: 3.89 MIL/uL (ref 3.87–5.11)
RDW: 13.8 % (ref 11.5–15.5)
WBC: 18.9 10*3/uL — ABNORMAL HIGH (ref 4.0–10.5)

## 2015-05-24 LAB — URIC ACID: Uric Acid, Serum: 3.6 mg/dL (ref 2.3–6.6)

## 2015-05-24 LAB — RPR: RPR: NONREACTIVE

## 2015-05-24 MED ORDER — LIDOCAINE HCL (PF) 1 % IJ SOLN
INTRAMUSCULAR | Status: DC | PRN
  Administered 2015-05-24 (×2): 4 mL

## 2015-05-24 MED ORDER — LIDOCAINE HCL (PF) 1 % IJ SOLN
30.0000 mL | INTRAMUSCULAR | Status: DC | PRN
  Administered 2015-05-24: 30 mL via SUBCUTANEOUS
  Filled 2015-05-24: qty 30

## 2015-05-24 MED ORDER — LACTATED RINGERS IV SOLN: 500.0000 mL | INTRAVENOUS | Status: DC | PRN

## 2015-05-24 MED ORDER — DIPHENHYDRAMINE HCL 50 MG/ML IJ SOLN: 12.5000 mg | INTRAMUSCULAR | Status: DC | PRN

## 2015-05-24 MED ORDER — TERBUTALINE SULFATE 1 MG/ML IJ SOLN: 0.2500 mg | Freq: Once | INTRAMUSCULAR | Status: DC | PRN

## 2015-05-24 MED ORDER — OXYTOCIN 10 UNIT/ML IJ SOLN: 2.5000 [IU]/h | INTRAVENOUS | Status: DC

## 2015-05-24 MED ORDER — OXYCODONE-ACETAMINOPHEN 5-325 MG PO TABS: 2.0000 | ORAL_TABLET | ORAL | Status: DC | PRN

## 2015-05-24 MED ORDER — FENTANYL CITRATE (PF) 100 MCG/2ML IJ SOLN
50.0000 ug | INTRAMUSCULAR | Status: DC | PRN
  Administered 2015-05-24: 100 ug via INTRAVENOUS
  Filled 2015-05-24: qty 2

## 2015-05-24 MED ORDER — EPHEDRINE 5 MG/ML INJ: 10.0000 mg | INTRAVENOUS | Status: DC | PRN

## 2015-05-24 MED ORDER — FENTANYL 2.5 MCG/ML BUPIVACAINE 1/10 % EPIDURAL INFUSION (WH - ANES)
14.0000 mL/h | INTRAMUSCULAR | Status: DC | PRN
  Administered 2015-05-24: 14 mL/h via EPIDURAL
  Filled 2015-05-24: qty 125

## 2015-05-24 MED ORDER — OXYCODONE-ACETAMINOPHEN 5-325 MG PO TABS: 1.0000 | ORAL_TABLET | ORAL | Status: DC | PRN

## 2015-05-24 MED ORDER — LACTATED RINGERS IV SOLN
INTRAVENOUS | Status: DC
  Administered 2015-05-24: 22:00:00 via INTRAVENOUS

## 2015-05-24 MED ORDER — ONDANSETRON HCL 4 MG/2ML IJ SOLN: 4.0000 mg | Freq: Four times a day (QID) | INTRAMUSCULAR | Status: DC | PRN

## 2015-05-24 MED ORDER — OXYTOCIN BOLUS FROM INFUSION: 500.0000 mL | INTRAVENOUS | Status: DC

## 2015-05-24 MED ORDER — CITRIC ACID-SODIUM CITRATE 334-500 MG/5ML PO SOLN: 30.0000 mL | ORAL | Status: DC | PRN

## 2015-05-24 MED ORDER — OXYTOCIN 10 UNIT/ML IJ SOLN
1.0000 m[IU]/min | INTRAVENOUS | Status: DC
  Administered 2015-05-24: 1 m[IU]/min via INTRAVENOUS

## 2015-05-24 MED ORDER — PHENYLEPHRINE 40 MCG/ML (10ML) SYRINGE FOR IV PUSH (FOR BLOOD PRESSURE SUPPORT)
80.0000 ug | PREFILLED_SYRINGE | INTRAVENOUS | Status: DC | PRN
  Filled 2015-05-24: qty 20

## 2015-05-24 MED ORDER — ACETAMINOPHEN 325 MG PO TABS: 650.0000 mg | ORAL_TABLET | ORAL | Status: DC | PRN

## 2015-05-24 NOTE — Progress Notes (Signed)
Sylvia Sylvia Holden MRN: [id number]  Subjective: -Patient reporting increased rectal pressure.  States pressure occurs during contractions, but contractions are feeling more frequent.   Objective: BP 122/81 mmHg  Pulse 73  Temp(Src) 98.1 F (36.7 C) (Oral)  Resp 20  Ht 5\' 8"  (1.727 m)  Wt 80.287 kg (177 lb)  BMI 26.92 kg/m2  SpO2 100%      Fetal Monitoring: FHT: 150 bpm, Mod Var, -Decels, +Accels UC: palpates mild to moderate    Vaginal Exam: SVE:   Dilation: 4 Effacement (%): 90 Station: +1 Exam by:: Sylvia Holden, CNM Membranes: AROM x 3 hrs Internal Monitors: None  Augmentation/Induction: Pitocin:5812mUn/min Cytotec: None  Assessment:  IUP at 38.6wks Cat I FT  NSR IOL s/t Recurrent Tachycardia GBS Negative H/O Anxiety  Plan: -Discussed VE findings -Encouraged position changes to promote relief from vertex on pelvic floor -Informed of availability of IV pain medications  -Continue other mgmt as ordered  Sylvia CavaJessica L [place],MSN, CNM 05/24/2015, 9:27 PM

## 2015-05-24 NOTE — Anesthesia Procedure Notes (Signed)
Epidural Patient location during procedure: OB  Staffing Anesthesiologist: Mathews Stuhr Performed by: anesthesiologist   Preanesthetic Checklist Completed: patient identified, site marked, surgical consent, pre-op evaluation, timeout performed, IV checked, risks and benefits discussed and monitors and equipment checked  Epidural Patient position: sitting Prep: site prepped and draped and DuraPrep Patient monitoring: continuous pulse ox and blood pressure Approach: midline Location: L3-L4 Injection technique: LOR saline  Needle:  Needle type: Tuohy  Needle gauge: 17 G Needle length: 9 cm and 9 Needle insertion depth: 5 cm cm Catheter type: closed end flexible Catheter size: 19 Gauge Catheter at skin depth: 10 cm Test dose: negative  Assessment Events: blood not aspirated, injection not painful, no injection resistance, negative IV test and no paresthesia  Additional Notes Patient identified. Risks/Benefits/Options discussed with patient including but not limited to bleeding, infection, nerve damage, paralysis, failed block, incomplete pain control, headache, blood pressure changes, nausea, vomiting, reactions to medication both or allergic, itching and postpartum back pain. Confirmed with bedside nurse the patient's most recent platelet count. Confirmed with patient that they are not currently taking any anticoagulation, have any bleeding history or any family history of bleeding disorders. Patient expressed understanding and wished to proceed. All questions were answered. Sterile technique was used throughout the entire procedure. Please see nursing notes for vital signs. Test dose was given through epidural catheter and negative prior to continuing to dose epidural or start infusion. Warning signs of high block given to the patient including shortness of breath, tingling/numbness in hands, complete motor block, or any concerning symptoms with instructions to call for help. Patient was  given instructions on fall risk and not to get out of bed. All questions and concerns addressed with instructions to call with any issues or inadequate analgesia.      

## 2015-05-24 NOTE — Progress Notes (Signed)
Dr. Elease HashimotoNahser (cardiologist) called to get update on patient's condition. Will contact later in the day.

## 2015-05-24 NOTE — Progress Notes (Signed)
  Subjective: Resting in bed--admits to anxiety regarding labor process, including AROM and pain management.  Hopes to use minimal pain med, if any--received Nubain x 1 with benefit just after foley placement, then pain subsided.  Partner at bedside, supportive.  Objective: BP 132/81 mmHg  Pulse 76  Temp(Src) 97.7 F (36.5 C) (Oral)  Resp 15  Ht 5\' 8"  (1.727 m)  Wt 80.287 kg (177 lb)  BMI 26.92 kg/m2  SpO2 100%      Filed Vitals:   05/24/15 0633 05/24/15 0638 05/24/15 0700 05/24/15 0730  BP:   117/82 132/81  Pulse:   79 76  Temp:    97.7 F (36.5 C)  TempSrc:    Oral  Resp: 22 14 13 15   Height:      Weight:      SpO2:         FHT: Category 1 UC:   irregular, every 2-5 minutes, mild SVE:   Dilation: 3 Effacement (%): 50 Station: Ballotable Exam by:: Sherre ScarletKimberly Williams, CNM at [phone], with foley out at that time. Pitocin at 3 mu/min.  Continuous cardiac monitor shows NSR per Jose Persiaarol Mead, RN, ICU nurse, who will be monitoring EKG today remotely.  . metoprolol tartrate  37.5 mg Oral BID  Next dose due at 1000  Assessment:  IUP at 38 6/7 weeks Intermittent SVT--followed by Dr. Melburn PopperNasher GBS negative Anxiety/panic hx Hx LEEP 2015 SGA at 34 weeks  Plan: Reviewed plan for induction due to intermittent SVT, including anticipation of decision for AROM as labor progresses. Discussed role of anxiety/panic hx/pain during labor, with encouragement for consideration of pain med/epidural if needed. OK for birthing ball use at bedside. Continue pitocin induction. If SVT occurs, plan per cardiology discussion with Dr. Mellissa Kohutillard--Metoprolol  5 mg IV q 4-6 hours prn SVT.  If ineffective, Adenosine 6 mg IV x 1.  If persists, notify Cardiology.  Nigel BridgemanLATHAM, [place] Sherr CNM 05/24/2015, 7:58 AM

## 2015-05-24 NOTE — Progress Notes (Signed)
Sylvia [name] MRN: [id number]  Subjective: -Care assumed of 24y.o. G1P0 at 38.6wks who presents for IOL, per cardiologist recommendation, secondary to Recurrent Tachycardia that began at 36wks.  Patient pregnancy also significant for lagging FL noted at 34.6 wks, but overall EFW 4lbs 9oz (24%tile).  Patient resting in bed.  Reports contractions have increased in frequency since AROM.  Patient denies HA, visual disturbances, epigastric pain, N/V, and SOB.    Objective: BP 138/85 mmHg  Pulse 63  Temp(Src) 98.1 F (36.7 C) (Oral)  Resp 18  Ht 5\' 8"  (1.727 m)  Wt 80.287 kg (177 lb)  BMI 26.92 kg/m2  SpO2 100%      Physical Exam: General appearance: alert, well appearing, and in no distress. Chest: clear to auscultation, no wheezes, rales or rhonchi, symmetric air entry.  CVS exam: normal rate and regular rhythm. Abdominal exam: Soft, NT, Appears AGA. Skin exam - normal coloration and turgor, no rashes, no suspicious skin lesions noted. Multiple tattoos Exam of extremities: pedal edema +3  Fetal Monitoring: FHT: 150 bpm, Mod Var, -Decels, +Accels UC: Occasionally graphed, palpates mild    Vaginal Exam: SVE:   Dilation: 3.5 Effacement (%): 70 Station: -1 Exam by:: Nigel BridgemanVicki Latham, cnm Membranes: AROM x 2 hrs Internal Monitors: None  Augmentation/Induction: Pitocin:7312mUn/min Cytotec: None  Assessment:  IUP at 38.6wks Cat I FT  IOL s/t Recurrent Tachycardia NSR GBS Negative Elevated BP  Plan: -PIH labs in process -Will draw PC Ratio as appropriate based on lab results -Discussed IV pain medication for pain -IV fentanyl 50-13100mcg as tolerated -Will place labor and delivery orders as appropriate -Pitocin to remain at 7112mUn/min until further notice -Continue other mgmt as ordered  Valma CavaJessica L Hilery Wintle,MSN, CNM 05/24/2015, 8:26 PM

## 2015-05-24 NOTE — Progress Notes (Signed)
  Subjective: Resting in bed--more aware of UCs when lying flat, feels they are "intense" when lying down.  Appears in NAD.  Objective: BP 124/83 mmHg  Pulse 67  Temp(Src) 98.2 F (36.8 C) (Oral)  Resp 15  Ht 5\' 8"  (1.727 m)  Wt 80.287 kg (177 lb)  BMI 26.92 kg/m2  SpO2 100%      Filed Vitals:   05/24/15 1136 05/24/15 1205 05/24/15 1237 05/24/15 1310  BP: 124/88 129/77 130/85 124/83  Pulse: 75 70 68 67  Temp:      TempSrc:      Resp: 14 16 10 15   Height:      Weight:      SpO2:        FHT: Category 1 UC:   regular, every 2-3 minutes, very mild SVE:   Deferred at present Pitocin at 9 mu/min  Assessment:  Induction for non-sustained ventricular tachycardia--HR WNL S/p foley bulb, now on pitocin. GBS negative  Plan: Continue current plan. Reassess as appropriate, anticipate AROM as appropriate.  Sylvia Holden CNM 05/24/2015, 1:25 PM

## 2015-05-24 NOTE — Progress Notes (Signed)
FOB at bedside.  Subjective: Sleeping on left side, easily aroused. Had one dose of Nubain at 2122 w/ good result. Endorses +FM. No leaking. Feeling anxious regarding induction process.   Objective: BP 131/80 mmHg  Pulse 71  Temp(Src) 98.8 F (37.1 C) (Oral)  Resp 14  Ht 5\' 8"  (1.727 m)  Wt 80.287 kg (177 lb)  BMI 26.92 kg/m2  SpO2 100%   Today's Vitals   05/24/15 0430 05/24/15 0530 05/24/15 0610 05/24/15 0620  BP:    131/80  Pulse:    71  Temp:      TempSrc:      Resp: 14 21 22 14   Height:      Weight:      SpO2:      PainSc:       Gen: Anxious CV: NSR, has not required IV Metoprolol or Adenosine FHT: BL 140 w/ moderate variability, +accels, no decels UC:   irregular, every 2-5 minutes SVE:   Dilation: 3 Effacement (%): 50 Station: Ballotable Exam by:: Sherre ScarletKimberly Elivia Robotham, CNM @ [phone] Foley bulb sitting in vagina, removed w/o incident @ 0610. Increased amount of bloody show noted. Cvx 3+ cm, BOW palpable, however head BLT Pitocin at 1 mU/min as of 0620  1st dose of Metoprolol po given at 2220  Assessment:  IOL due to recurrent tachycardia (VT vs SVT) Normal Sinus Rhythm throughout night Cat 1 FHRT GBS neg  H/O anxiety  Plan: Reiterated induction process. Since FB removed, will now proceed w/ Pitocin. Reviewed in detail. Informed will rupture membranes when appropriate and continue to  actively manage her labor. Pt to be moved to room 172 due to its central cardiac monitoring capability. Cardiac activity will be monitored by ICU nurse. Will update Dr. Richardson Doppole on status/events.    Sherre ScarletWILLIAMS, Sevannah Madia CNM 05/24/2015, 6:33 AM

## 2015-05-24 NOTE — Progress Notes (Signed)
Subjective: Aware of contractions, but hasn't been very uncomfortable.  Objective: BP 130/93 mmHg  Pulse 80  Temp(Src) 98.5 F (36.9 C) (Axillary)  Resp 17  Ht 5\' 8"  (1.727 m)  Wt 80.287 kg (177 lb)  BMI 26.92 kg/m2  SpO2 100%      Filed Vitals:   05/24/15 1735 05/24/15 1804 05/24/15 1808 05/24/15 1827  BP: 140/87  129/88 130/93  Pulse: 87  77 80  Temp: 98.5 F (36.9 C)     TempSrc: Axillary     Resp: 17 17    Height:      Weight:      SpO2:        FHT: Category 1 UC:   regular, every 2-3 minutes, mild SVE:   Dilation: 3.5 Effacement (%): 70 Station: -1 Exam by:: Nigel BridgemanVicki Macyn Remmert, cnm  Cervix more anterior, soft, more effaced, vtx well-applied, BBOW AROM--clear fluid, small amount bloody show. Pitocin at 12 mu/min  Assessment:  Induction for NSVT--HR WNL Early labor--hx foley bulb, now on pitocin GBS negative Mild elevation of BP--no hx. Anxiety  Plan: Continue current care. Check PIH labs.    Nigel BridgemanLATHAM, Arif Amendola CNM 05/24/2015, 6:37 PM

## 2015-05-24 NOTE — Anesthesia Preprocedure Evaluation (Signed)
Anesthesia Evaluation  Patient identified by MRN, date of birth, ID band Patient awake    Reviewed: Allergy & Precautions, NPO status , Patient's Chart, lab work & pertinent test results  History of Anesthesia Complications Negative for: history of anesthetic complications  Airway Mallampati: II  TM Distance: >3 FB Neck ROM: Full    Dental no notable dental hx. (+) Dental Advisory Given   Pulmonary neg pulmonary ROS,    Pulmonary exam normal breath sounds clear to auscultation       Cardiovascular Normal cardiovascular exam+ dysrhythmias Supra Ventricular Tachycardia  Rhythm:Regular Rate:Normal     Neuro/Psych PSYCHIATRIC DISORDERS Anxiety negative neurological ROS     GI/Hepatic negative GI ROS, Neg liver ROS,   Endo/Other  negative endocrine ROS  Renal/GU negative Renal ROS  negative genitourinary   Musculoskeletal negative musculoskeletal ROS (+)   Abdominal   Peds negative pediatric ROS (+)  Hematology negative hematology ROS (+)   Anesthesia Other Findings   Reproductive/Obstetrics (+) Pregnancy                             Anesthesia Physical Anesthesia Plan  ASA: II  Anesthesia Plan: Epidural   Post-op Pain Management:    Induction:   Airway Management Planned:   Additional Equipment:   Intra-op Plan:   Post-operative Plan:   Informed Consent: I have reviewed the patients History and Physical, chart, labs and discussed the procedure including the risks, benefits and alternatives for the proposed anesthesia with the patient or authorized representative who has indicated his/her understanding and acceptance.   Dental advisory given  Plan Discussed with: CRNA  Anesthesia Plan Comments:         Anesthesia Quick Evaluation

## 2015-05-25 ENCOUNTER — Encounter (HOSPITAL_COMMUNITY): Payer: Self-pay | Admitting: *Deleted

## 2015-05-25 DIAGNOSIS — I472 Ventricular tachycardia: Secondary | ICD-10-CM

## 2015-05-25 LAB — CBC
HCT: 30.3 % — ABNORMAL LOW (ref 36.0–46.0)
Hemoglobin: 9.9 g/dL — ABNORMAL LOW (ref 12.0–15.0)
MCH: 29.7 pg (ref 26.0–34.0)
MCHC: 32.7 g/dL (ref 30.0–36.0)
MCV: 91 fL (ref 78.0–100.0)
PLATELETS: 245 10*3/uL (ref 150–400)
RBC: 3.33 MIL/uL — ABNORMAL LOW (ref 3.87–5.11)
RDW: 13.8 % (ref 11.5–15.5)
WBC: 18.5 10*3/uL — AB (ref 4.0–10.5)

## 2015-05-25 LAB — PROTEIN / CREATININE RATIO, URINE
Creatinine, Urine: 35 mg/dL
Protein Creatinine Ratio: 0.37 mg/mg{Cre} — ABNORMAL HIGH (ref 0.00–0.15)
Total Protein, Urine: 13 mg/dL

## 2015-05-25 MED ORDER — IBUPROFEN 600 MG PO TABS
600.0000 mg | ORAL_TABLET | Freq: Four times a day (QID) | ORAL | Status: DC
  Administered 2015-05-25 (×3): 600 mg via ORAL
  Filled 2015-05-25 (×3): qty 1

## 2015-05-25 MED ORDER — DIBUCAINE 1 % RE OINT
1.0000 "application " | TOPICAL_OINTMENT | RECTAL | Status: DC | PRN
  Filled 2015-05-25: qty 28

## 2015-05-25 MED ORDER — SIMETHICONE 80 MG PO CHEW: 80.0000 mg | CHEWABLE_TABLET | ORAL | Status: DC | PRN

## 2015-05-25 MED ORDER — ZOLPIDEM TARTRATE 5 MG PO TABS: 5.0000 mg | ORAL_TABLET | Freq: Every evening | ORAL | Status: DC | PRN

## 2015-05-25 MED ORDER — BENZOCAINE-MENTHOL 20-0.5 % EX AERO
1.0000 "application " | INHALATION_SPRAY | CUTANEOUS | Status: DC | PRN
  Administered 2015-05-25: 1 via TOPICAL
  Filled 2015-05-25 (×2): qty 56

## 2015-05-25 MED ORDER — TETANUS-DIPHTH-ACELL PERTUSSIS 5-2.5-18.5 LF-MCG/0.5 IM SUSP
0.5000 mL | Freq: Once | INTRAMUSCULAR | Status: DC
  Filled 2015-05-25: qty 0.5

## 2015-05-25 MED ORDER — DIPHENHYDRAMINE HCL 25 MG PO CAPS: 25.0000 mg | ORAL_CAPSULE | Freq: Four times a day (QID) | ORAL | Status: DC | PRN

## 2015-05-25 MED ORDER — DIBUCAINE 1 % RE OINT: 1.0000 "application " | TOPICAL_OINTMENT | RECTAL | Status: DC | PRN

## 2015-05-25 MED ORDER — ONDANSETRON HCL 4 MG/2ML IJ SOLN: 4.0000 mg | INTRAMUSCULAR | Status: DC | PRN

## 2015-05-25 MED ORDER — OXYCODONE-ACETAMINOPHEN 5-325 MG PO TABS
1.0000 | ORAL_TABLET | ORAL | Status: DC | PRN
  Administered 2015-05-25 (×2): 1 via ORAL
  Filled 2015-05-25 (×2): qty 1

## 2015-05-25 MED ORDER — PRENATAL MULTIVITAMIN CH
1.0000 | ORAL_TABLET | Freq: Every day | ORAL | Status: DC
  Administered 2015-05-26: 1 via ORAL
  Filled 2015-05-25: qty 1

## 2015-05-25 MED ORDER — WITCH HAZEL-GLYCERIN EX PADS: 1.0000 "application " | MEDICATED_PAD | CUTANEOUS | Status: DC | PRN

## 2015-05-25 MED ORDER — LANOLIN HYDROUS EX OINT: TOPICAL_OINTMENT | CUTANEOUS | Status: DC | PRN

## 2015-05-25 MED ORDER — BENZOCAINE-MENTHOL 20-0.5 % EX AERO: 1.0000 "application " | INHALATION_SPRAY | CUTANEOUS | Status: DC | PRN

## 2015-05-25 MED ORDER — ACETAMINOPHEN 325 MG PO TABS
650.0000 mg | ORAL_TABLET | ORAL | Status: DC | PRN
  Administered 2015-05-26: 650 mg via ORAL
  Filled 2015-05-25: qty 2

## 2015-05-25 MED ORDER — SENNOSIDES-DOCUSATE SODIUM 8.6-50 MG PO TABS
2.0000 | ORAL_TABLET | ORAL | Status: DC
  Filled 2015-05-25: qty 2

## 2015-05-25 MED ORDER — ONDANSETRON HCL 4 MG PO TABS: 4.0000 mg | ORAL_TABLET | ORAL | Status: DC | PRN

## 2015-05-25 MED ORDER — ACETAMINOPHEN 325 MG PO TABS: 650.0000 mg | ORAL_TABLET | ORAL | Status: DC | PRN

## 2015-05-25 MED ORDER — TETANUS-DIPHTH-ACELL PERTUSSIS 5-2.5-18.5 LF-MCG/0.5 IM SUSP: 0.5000 mL | Freq: Once | INTRAMUSCULAR | Status: DC

## 2015-05-25 MED ORDER — PRENATAL MULTIVITAMIN CH
1.0000 | ORAL_TABLET | Freq: Every day | ORAL | Status: DC
  Administered 2015-05-25: 1 via ORAL
  Filled 2015-05-25: qty 1

## 2015-05-25 MED ORDER — SENNOSIDES-DOCUSATE SODIUM 8.6-50 MG PO TABS: 2.0000 | ORAL_TABLET | ORAL | Status: DC

## 2015-05-25 MED ORDER — IBUPROFEN 600 MG PO TABS
600.0000 mg | ORAL_TABLET | Freq: Four times a day (QID) | ORAL | Status: DC
  Administered 2015-05-26: 600 mg via ORAL
  Filled 2015-05-25 (×3): qty 1

## 2015-05-25 NOTE — Anesthesia Postprocedure Evaluation (Signed)
Anesthesia Post Note  Patient: Sylvia Holden  Procedure(s) Performed: * No procedures listed *  Patient location during evaluation: Mother Baby Anesthesia Type: Epidural Level of consciousness: awake and alert Pain management: satisfactory to patient Vital Signs Assessment: post-procedure vital signs reviewed and stable Respiratory status: respiratory function stable Cardiovascular status: stable Postop Assessment: no headache, no backache, epidural receding, patient able to bend at knees, no signs of nausea or vomiting and adequate PO intake Anesthetic complications: no    Last Vitals:  Filed Vitals:   05/25/15 1010 05/25/15 1217  BP:  114/70  Pulse:  76  Temp:    Resp: 18 16    Last Pain:  Filed Vitals:   05/25/15 1218  PainSc: 2                  Nadav Swindell

## 2015-05-25 NOTE — Lactation Note (Signed)
This note was copied from the chart of Sylvia Saidi Lohmeyer. Lactation Consultation Note  Initial visit made.  Breastfeeding consultation services and support information given and reviewed.  Baby is 212 hours old and has had a few latch attempts but mostly sleepy.  Mom took a breastfeeding class and has a strong commitment to breastfeeding her baby.  Baby is currently sleeping and waking techniques done.  She became spitty and spit moderate amount of clear fluid twice.  Baby placed skin to skin with mom.  Mom has erect short nipples.  Instructed on hand expression but no milk visible.  Baby did show some feeding cues but unable to latch to breast.  She is very sleepy.  Mom given shells and manual pump with instructions.  Instructed to pre pump a few minutes prior to latch.  Foley feeding cup also given with instructions.  Recommended mom use hand expression every few hours and cup feed EBM if obtained.  Encouraged to call for assist if needed.  Mom has a DEBP set up which she has used once.  She reports drops obtained. Patient Name: Sylvia Freddi [name] ZOXWR'UToday's Date: 05/25/2015 Reason for consult: Initial assessment   Maternal Data Formula Feeding for Exclusion: No Has patient been taught Hand Expression?: Yes Does the patient have breastfeeding experience prior to this delivery?: No  Feeding Feeding Type: Breast Fed  LATCH Score/Interventions Latch: Too sleepy or reluctant, no latch achieved, no sucking elicited. Intervention(s): Skin to skin;Teach feeding cues;Waking techniques Intervention(s): Adjust position;Assist with latch;Breast massage;Breast compression  Audible Swallowing: None Intervention(s): Skin to skin;Hand expression Intervention(s): Alternate breast massage  Type of Nipple: Everted at rest and after stimulation Intervention(s): Hand pump;Double electric pump  Comfort (Breast/Nipple): Soft / non-tender     Hold (Positioning): Assistance needed to correctly position infant at  breast and maintain latch. Intervention(s): Breastfeeding basics reviewed;Support Pillows;Position options;Skin to skin  LATCH Score: 5  Lactation Tools Discussed/Used Tools: Shells;Feeding cup Shell Type: Inverted   Consult Status Consult Status: Follow-up Date: 05/26/15 Follow-up type: In-patient    [name], [name] 05/25/2015, 12:56 PM

## 2015-05-25 NOTE — Progress Notes (Addendum)
Subjective: Postpartum Day 1: Vaginal delivery, 2nd degree perineal and bilateral labial laceration Patient currently in bed, on continuous cardiac monitoring, reports no syncope or dizziness.  Reports feeling "tired". Denies HA, visual disturbances or epigastric pain Feeding:  breastfeeding Contraceptive plan: oral pill Pain management:  Motrin and Percocet    Objective: Vital signs in last 24 hours: Temp:  [98.1 F (36.7 C)-98.6 F (37 C)] 98.6 F (37 C) (01/05 0417) Pulse Rate:  [60-226] 89 (01/05 0805) Resp:  [10-20] 18 (01/05 1010) BP: (106-154)/(44-107) 124/75 mmHg (01/05 0805) SpO2:  [76 %-100 %] 100 % (01/04 2228)   Filed Vitals:   05/25/15 0505 05/25/15 0805 05/25/15 1010 05/25/15 1217  BP: 126/78 124/75  114/70  Pulse: 101 89  76  Temp:      TempSrc:      Resp: 18 16 18 16   Height:      Weight:      SpO2:           Physical Exam:  General: alert and cooperative Lochia: appropriate Uterine Fundus: firm Perineum: healing well DVT Evaluation: No evidence of DVT seen on physical exam.   CBC Latest Ref Rng 05/25/2015 05/24/2015 05/23/2015  WBC 4.0 - 10.5 K/uL 18.5(H) 18.9(H) 15.5(H)  Hemoglobin 12.0 - 15.0 g/dL 9.5(A9.9(L) 11.9(L) 12.5  Hematocrit 36.0 - 46.0 % 30.3(L) 35.6(L) 37.3  Platelets 150 - 400 K/uL 245 262 306     Assessment/Plan: Status post vaginal delivery day 1. Stable Cardiology consult today Continuous cardiac monitoring  Continue current care.     Sylvia FettersRachel StallCNM 05/25/2015, 12:17 PM  Cardiac Consult today.  Dx: Ventricular tachycardia vs. superventricular tachycardia Per Cardiology consult, may DC tele-monitoring.  Continue Metoprolol 37.5 PO BID upon discharge Pt has appointment to Dr. Elease HashimotoNahser in several weeks Instructed to call [facility] if she has additional episodes of tachycardia upon discharge  Transfer to Mom-baby.   Sylvia Severanceachel Yisell Holden, CNM 05/25/15,  [phone]

## 2015-05-25 NOTE — Clinical Social Work Maternal (Signed)
CLINICAL SOCIAL WORK MATERNAL/CHILD NOTE  Patient Details  Name: Sylvia Holden MRN: [id number] Date of Birth: 1991-03-06  Date:  05/25/2015  Clinical Social Worker Initiating Note:  Loleta Books MSW, LCSW Date/ Time Initiated:  05/25/15/1445    Child's Name:  Sylvia Holden   Legal Guardian:  Freddi Che and Juliann Pares  Need for Interpreter:  None   Date of Referral:  05/24/15     Reason for Referral:  Other (Comment)   Referral Source:  Central Nursery   Address:  [address] [place], [place] [address]  Phone number:  [phone]   Household Members:  Spouse   Natural Supports (not living in the home):  Immediate Family, Extended Family, Friends   Radiographer, therapeutic Supports: None   Employment: Environmental education officer   Type of Work: Chief Strategy Officer:    N/A  Surveyor, quantity Resources:  [employer]   Other Resources:    [facility]  Cultural/Religious Considerations Which May Impact Care:  None reported  Strengths:  Ability to meet basic needs , Merchandiser, retail , Home prepared for child    Risk Factors/Current Problems:  Mental Health Concerns    Cognitive State:  Able to Concentrate , Alert , Goal Oriented , Linear Thinking    Mood/Affect:  Happy , Comfortable , Calm    CSW Assessment:  CSW received request for consult due to MOB presenting with a history of anxiety.  MOB presented as easily engaged and receptive to the visit. She expressed appreciation for the visit, was in a pleasant mood, and displayed a full range in affect.   MOB expressed feelings of happiness secondary to the infant's birth. She stated that breastfeeding has been difficult, but also recognizes that it is a learning process.  MOB exhibited patience as she discussed breastfeeding and expressed hope that it will continue to improve with time.  MOB shared that she had originally planned for an un-medicated birth, but chose to have an epidural. Per MOB, labor quickly progressed with brief pushing  once she chose to have an epidural.  MOB stated that she feels happy and contact with her decision to have an epidural since she believes it was best for her and her health.  MOB denied any negative core beliefs about herself due to the change.    MOB presented as coping well as she discussed the uncertainties of her cardiac condition. She stated that she has experienced notable anxiety secondary to not knowing the exact extent and severity of her cardiac diagnoses, but reported that she has needed to distract herself in order to focus on something else, or talk to her family and support system. Per MOB, her support system has reminded her that everything will be "okay".  MOB stated that she continues to maintain hope of a good prognosis, and stated that she knows that she has a good care team to address her medical needs.   MOB confirmed history of anxiety, and stated that her anxiety increased during the pregnancy. Per MOB, she experienced difficulties sleeping due to anxiety, felt constantly on "edge", and restless.  MOB endorsed presence of constantly worrying about worst case scenarios and thinking about "what ifs". MOB stated that she frequently spoke to the FOB and found his presence calming, and attempted to take deep breaths and utilize positive self-talk.  MOB reported that she is currently in therapy, and attends sessions with her FOB (who receives therapy through [facility] as a Consulting civil engineer).  MOB reported that therapy is helpful and she  intends to continue.  MOB and FOB spoke about previous conversations related to medication to assist with symptoms. MOB denied previous medication, but expressed interest since she notes that anxiety occurs for large portions of day, and for more days than not.  MOB stated that she would prefer to not start medication at the hospital, but expressed interest in speaking to her medical provider during her postpartum visit. MOB acknowledged that she is able to consult with [facility]  prior to her 6 week follow up visit if needed.  MOB agreed for CSW to collaborate with [facility] and recommend discussing medications at her postpartum visit.   MOB and FOB presented as receptive and interested while CSW provided education on perinatal mood and anxiety disorders. MOB acknowledged the commonality of symptoms and presence of evidence based treatments.  CSW provided education on nonpharmacologic interventions that support mental health, and informed MOB of the outpatient support group, Feelings After Birth.   MOB and FOB denied questions, concerns, or needs at this time. MOB presented with insight and self-awareness related to her mental health needs, and expressed interest and motivation to address her needs as she transitions postpartum. MOB acknowledged ongoing availability of CSW, and agreed to contact CSW if needs arise.   CSW Plan/Description:   1. Patient/Family Education-- perinatal mood and anxiety disorders 2. Information/Referral to [facility]-- [facility] After [facility] 3. No Further Intervention Required/No Barriers to Discharge    Kelby FamVenning, Larnie Heart N, LCSW 05/25/2015, 3:20 PM

## 2015-05-25 NOTE — Progress Notes (Signed)
    Sylvia Holden is doing well. She delivered a healthy baby girl overnight. Had a few episodes of tachycardia - last for a few minutes.   Centralized tele is not available in L&D but she was monitored with a bedside tele monitor.  She has done well through the day and has not had any episodes   At this point, she may DC tele monitoring. Continue Metoprolol 37.5 PO BID upon discharge. She has an appt. To see me in several weeks.   She should call the office if she has any additional episodes of tachycardia  Dx:   Ventricular tachycardia vs. Supraventricular tachycardia    Hjalmar Ballengee, Deloris PingPhilip J, MD  05/25/2015 5:35 PM    [facility] [address],  [address] [place], [place]  [id number] Pager [phone] Phone: [phone]; Fax: [phone]

## 2015-05-26 ENCOUNTER — Ambulatory Visit: Payer: Self-pay

## 2015-05-26 LAB — CBC
HCT: 30.2 % — ABNORMAL LOW (ref 36.0–46.0)
Hemoglobin: 10 g/dL — ABNORMAL LOW (ref 12.0–15.0)
MCH: 30.6 pg (ref 26.0–34.0)
MCHC: 33.1 g/dL (ref 30.0–36.0)
MCV: 92.4 fL (ref 78.0–100.0)
PLATELETS: 269 10*3/uL (ref 150–400)
RBC: 3.27 MIL/uL — AB (ref 3.87–5.11)
RDW: 14 % (ref 11.5–15.5)
WBC: 15.5 10*3/uL — AB (ref 4.0–10.5)

## 2015-05-26 MED ORDER — IBUPROFEN 600 MG PO TABS: 600.0000 mg | ORAL_TABLET | Freq: Four times a day (QID) | ORAL | Status: DC

## 2015-05-26 MED ORDER — METOPROLOL TARTRATE 37.5 MG PO TABS: 37.5000 mg | ORAL_TABLET | Freq: Two times a day (BID) | ORAL | Status: DC

## 2015-05-26 MED ORDER — SENNOSIDES-DOCUSATE SODIUM 8.6-50 MG PO TABS: 2.0000 | ORAL_TABLET | Freq: Every evening | ORAL | Status: DC | PRN

## 2015-05-26 NOTE — Lactation Note (Addendum)
This note was copied from the chart of Sylvia Holden. Lactation Consultation Note New mom says baby is cluster feeding and doesn't seem satisfied. Moms nipples are red and tender. No bruising noted. Has short shaft, when compressed, nipple flattens. Breast feels slightly heavy. Massaged breast, hand expression noted, obtained 5ml colostrum. Mom excited! Fitted #20NS, inserted colostrum into NS. Latched baby, taught application and latch. Baby BF 10 min. Then sleeping soundly. Gave the rest of colostrum w/gloved finger and curved tip syring. Gave total of 5ml. Comfort gels given. Mom wearing shells between BF. Has DEBP set up for post-pumping. Baby slightly jaundice looking. Explained to mom since baby is 5lb 12oz. She needs to supplement after BF. She can pump and hand express and give colostrum, and also give formula in syring into NS. Mom stated she would try to give colostrum first, and if not enough she would give formula. Patient Name: Sylvia Freddi CheMelanie Baar ZOXWR'UToday's Date: 05/26/2015 Reason for consult: Follow-up assessment;Infant < 6lbs;Breast/nipple pain;Infant weight loss   Maternal Data    Feeding Feeding Type: Breast Milk Length of feed: 10 min  LATCH Score/Interventions Latch: Repeated attempts needed to sustain latch, nipple held in mouth throughout feeding, stimulation needed to elicit sucking reflex. Intervention(s): Skin to skin;Teach feeding cues;Waking techniques Intervention(s): Adjust position;Assist with latch;Breast massage;Breast compression  Audible Swallowing: Spontaneous and intermittent Intervention(s): Skin to skin;Hand expression Intervention(s): Skin to skin;Hand expression;Alternate breast massage  Type of Nipple: Flat Intervention(s): Double electric pump;Shells  Comfort (Breast/Nipple): Filling, red/small blisters or bruises, mild/mod discomfort  Problem noted: Mild/Moderate discomfort Interventions (Mild/moderate discomfort): Comfort gels;Breast  shields;Post-pump;Hand expression;Hand massage  Hold (Positioning): Assistance needed to correctly position infant at breast and maintain latch. Intervention(s): Skin to skin;Position options;Support Pillows;Breastfeeding basics reviewed  LATCH Score: 6  Lactation Tools Discussed/Used Tools: Shells;Nipple Shields;Pump;Comfort gels Nipple shield size: 20;16 Shell Type: Inverted Breast pump type: Double-Electric Breast Pump Pump Review: Setup, frequency, and cleaning;Milk Storage Initiated by:: RN Date initiated:: 05/25/15   Consult Status Consult Status: Follow-up Date: 05/26/15 (in pm) Follow-up type: In-patient    Charyl DancerCARVER, Marrion Accomando G 05/26/2015, 3:46 AM

## 2015-05-26 NOTE — Lactation Note (Signed)
This note was copied from the chart of [name]. Lactation Consultation Note  Patient Name: [name] ZOXWR'UToday's Date: 05/26/2015 Reason for consult: Follow-up assessment Baby at 42 hr of life and mom reports feedings are going well. She is pumping after feeding and saving the milk to give in the NS with a curved tip syring at the next feeding. Denies breast or nipple pain. Stated that she is very happy with the way things are going today. She thinks that she and baby have gotten more comfortable with bf. Encouraged mom to continue feeding baby on demand and offering her pumped milk. She is aware of OP services and support group. She will call as needed for bf help.    Maternal Data    Feeding Feeding Type: Breast Fed Length of feed: 45 min  LATCH Score/Interventions                      Lactation Tools Discussed/Used Tools: Nipple Shields Nipple shield size: 20   Consult Status Consult Status: Follow-up Date: 05/27/15 Follow-up type: In-patient    Sylvia Holden 05/26/2015, 5:48 PM

## 2015-05-26 NOTE — Progress Notes (Signed)
UR chart review completed.  

## 2015-05-26 NOTE — Discharge Summary (Signed)
OB Discharge Summary  Patient Name: Sylvia Holden DOB: 01-Sep-1990 MRN: [id number]  Date of admission: 05/23/2015 Delivering MD: Gerrit Heck   Date of discharge: 05/26/2015  Admitting diagnosis: direct admit, induction 38.5w Intrauterine pregnancy: [redacted]w[redacted]d     Secondary diagnosis:Principal Problem:   SVD (spontaneous vaginal delivery) Active Problems:   NSVT (nonsustained ventricular tachycardia) (HCC)   H/O LEEP (loop electrosurgical excision procedure) of cervix complicating pregnancy (05/06/14)   Hx of migraines   History of chronic back pain   History of asthma   History of anxiety   Needle phobia   SGA (small for gestational age)   Panic attacks   H/O seasonal allergies   Second-degree perineal laceration, with delivery   Obstetric labial laceration, delivered, current hospitalization   Spontaneous vaginal delivery  Additional problems:none     Discharge diagnosis: Term Pregnancy Delivered                                                                     Post partum procedures:none  Augmentation: AROM, Pitocin and Foley Balloon  Complications: None  Hospital course:  Induction of Labor With Vaginal Delivery   25 y.o. yo G1P1001 at [redacted]w[redacted]d was admitted to the hospital 05/23/2015 for induction of labor.  Indication for induction: tachycardia -VT versus SVT.  Patient had an uncomplicated labor course as follows: Membrane Rupture Time/Date: 6:15 PM ,05/24/2015   Intrapartum Procedures: Episiotomy: None [1]                                         Lacerations:  2nd degree [3];Labial [10]  Patient had delivery of a Viable infant.  Information for the patient's newborn:  Sylvia Holden, Leason Girl Teddie [[id number]]  Delivery Method: Vaginal, Spontaneous Delivery (Filed from Delivery Summary)   05/24/2015  Details of delivery can be found in separate delivery note.  Patient had a routine postpartum course. Patient is discharged home 05/26/2015.   Physical exam  Filed Vitals:   05/25/15 2208 05/25/15 2220 05/26/15 0400 05/26/15 0520  BP: 136/77 122/70 119/78 119/72  Pulse: 82 86 60 60  Temp:  97.6 F (36.4 C) 98.2 F (36.8 C) 98 F (36.7 C)  TempSrc:  Oral    Resp:  18 18 18   Height:      Weight:      SpO2:  97%  100%   General: alert and cooperative Lochia: appropriate Uterine Fundus: firm Incision: Healing well with no significant drainage DVT Evaluation: No evidence of DVT seen on physical exam. Labs: Lab Results  Component Value Date   WBC 15.5* 05/26/2015   HGB 10.0* 05/26/2015   HCT 30.2* 05/26/2015   MCV 92.4 05/26/2015   PLT 269 05/26/2015   CMP Latest Ref Rng 05/24/2015  Glucose 65 - 99 mg/dL 75  BUN 6 - 20 mg/dL <7(W)  Creatinine 2.95 - 1.00 mg/dL 6.21(H)  Sodium 086 - 578 mmol/L 139  Potassium 3.5 - 5.1 mmol/L 3.8  Chloride 101 - 111 mmol/L 111  CO2 22 - 32 mmol/L 19(L)  Calcium 8.9 - 10.3 mg/dL 4.6(N)  Total Protein 6.5 - 8.1 g/dL 6.7  Total Bilirubin 0.3 -  1.2 mg/dL 0.7  Alkaline Phos 38 - 126 U/L 99  AST 15 - 41 U/L 18  ALT 14 - 54 U/L 14    Discharge instruction: per After Visit Summary and "Baby and Me Booklet".  Medications:  Current facility-administered medications:  .  acetaminophen (TYLENOL) tablet 650 mg, 650 mg, Oral, Q4H PRN, Alphonzo Severanceachel Stall, CNM .  benzocaine-Menthol (DERMOPLAST) 20-0.5 % topical spray 1 application, 1 application, Topical, PRN, Alphonzo Severanceachel Stall, CNM .  witch hazel-glycerin (TUCKS) pad 1 application, 1 application, Topical, PRN **AND** dibucaine (NUPERCAINAL) 1 % rectal ointment 1 application, 1 application, Rectal, PRN, Alphonzo Severanceachel Stall, CNM .  diphenhydrAMINE (BENADRYL) capsule 25 mg, 25 mg, Oral, Q6H PRN, Alphonzo Severanceachel Stall, CNM .  ibuprofen (ADVIL,MOTRIN) tablet 600 mg, 600 mg, Oral, 4 times per day, Alphonzo Severanceachel Stall, CNM .  lanolin ointment, , Topical, PRN, Alphonzo Severanceachel Stall, CNM .  lidocaine (PF) (XYLOCAINE) 1 % injection 30 mL, 30 mL, Subcutaneous, PRN, Gerrit HeckJessica Emly, CNM, 30 mL at 05/24/15 2335 .  metoprolol  tartrate (LOPRESSOR) tablet 37.5 mg, 37.5 mg, Oral, BID, Sherre ScarletKimberly Williams, CNM, 37.5 mg at 05/25/15 2208 .  ondansetron (ZOFRAN) tablet 4 mg, 4 mg, Oral, Q4H PRN **OR** ondansetron (ZOFRAN) injection 4 mg, 4 mg, Intravenous, Q4H PRN, Alphonzo Severanceachel Stall, CNM .  prenatal multivitamin tablet 1 tablet, 1 tablet, Oral, Q1200, Alphonzo Severanceachel Stall, CNM .  senna-docusate (Senokot-S) tablet 2 tablet, 2 tablet, Oral, Q24H, Alphonzo Severanceachel Stall, CNM .  simethicone (MYLICON) chewable tablet 80 mg, 80 mg, Oral, PRN, Alphonzo Severanceachel Stall, CNM .  Tdap (BOOSTRIX) injection 0.5 mL, 0.5 mL, Intramuscular, Once, Alphonzo Severanceachel Stall, CNM .  zolpidem (AMBIEN) tablet 5 mg, 5 mg, Oral, QHS PRN, Alphonzo Severanceachel Stall, CNM After Visit Meds:    Medication List    TAKE these medications        acetaminophen 325 MG tablet  Commonly known as:  TYLENOL  Take 2 tablets (650 mg total) by mouth every 4 (four) hours as needed for headache or mild pain.     ibuprofen 600 MG tablet  Commonly known as:  ADVIL,MOTRIN  Take 1 tablet (600 mg total) by mouth every 6 (six) hours.     Metoprolol Tartrate 37.5 MG Tabs  Take 37.5 mg by mouth 2 (two) times daily.     prenatal multivitamin Tabs tablet  Take 1 tablet by mouth daily at 12 noon.     senna-docusate 8.6-50 MG tablet  Commonly known as:  Senokot-S  Take 2 tablets by mouth at bedtime as needed for mild constipation.        Diet: routine diet  Activity: Advance as tolerated. Pelvic rest for 6 weeks.   Outpatient follow up:6 weeks Follow up Appt:Future Appointments Date Time Provider Department Center  06/01/2015 2:30 PM Duke SalviaSteven C Klein, MD [facility]  06/19/2015 10:45 AM Vesta MixerPhilip J Nahser, MD [facility]   Follow up visit: No Follow-up on file.  Postpartum contraception: Progesterone only pills  Newborn Data: Live born female  Birth Weight: 6 lb 0.7 oz (2740 g) APGAR: 9, 9  Baby Feeding: Breast Disposition:home with mother   05/26/2015 Sylvia Holden,  CNM      Postpartum Care After Vaginal Delivery  After you deliver your newborn (postpartum period), the usual stay in the hospital is 24 72 hours. If there were problems with your labor or delivery, or if you have other medical problems, you might be in the hospital longer.  While you are in the hospital, you will receive help and instructions on how to  care for yourself and your newborn during the postpartum period.  While you are in the hospital:  Be sure to tell your nurses if you have pain or discomfort, as well as where you feel the pain and what makes the pain worse.  If you had an incision made near your vagina (episiotomy) or if you had some tearing during delivery, the nurses may put ice packs on your episiotomy or tear. The ice packs may help to reduce the pain and swelling.  If you are breastfeeding, you may feel uncomfortable contractions of your uterus for a couple of weeks. This is normal. The contractions help your uterus get back to normal size.  It is normal to have some bleeding after delivery.  For the first 1 3 days after delivery, the flow is red and the amount may be similar to a period.  It is common for the flow to start and stop.  In the first few days, you may pass some small clots. Let your nurses know if you begin to pass large clots or your flow increases.  Do not  flush blood clots down the toilet before having the nurse look at them.  During the next 3 10 days after delivery, your flow should become more watery and pink or brown-tinged in color.  Ten to fourteen days after delivery, your flow should be a small amount of yellowish-white discharge.  The amount of your flow will decrease over the first few weeks after delivery. Your flow may stop in 6 8 weeks. Most women have had their flow stop by 12 weeks after delivery.  You should change your sanitary pads frequently.  Wash your hands thoroughly with soap and water for at least 20 seconds after  changing pads, using the toilet, or before holding or feeding your newborn.  You should feel like you need to empty your bladder within the first 6 8 hours after delivery.  In case you become weak, lightheaded, or faint, call your nurse before you get out of bed for the first time and before you take a shower for the first time.  Within the first few days after delivery, your breasts may begin to feel tender and full. This is called engorgement. Breast tenderness usually goes away within 48 72 hours after engorgement occurs. You may also notice milk leaking from your breasts. If you are not breastfeeding, do not stimulate your breasts. Breast stimulation can make your breasts produce more milk.  Spending as much time as possible with your newborn is very important. During this time, you and your newborn can feel close and get to know each other. Having your newborn stay in your room (rooming in) will help to strengthen the bond with your newborn. It will give you time to get to know your newborn and become comfortable caring for your newborn.  Your hormones change after delivery. Sometimes the hormone changes can temporarily cause you to feel sad or tearful. These feelings should not last more than a few days. If these feelings last longer than that, you should talk to your caregiver.  If desired, talk to your caregiver about methods of family planning or contraception.  Talk to your caregiver about immunizations. Your caregiver may want you to have the following immunizations before leaving the hospital:  Tetanus, diphtheria, and pertussis (Tdap) or tetanus and diphtheria (Td) immunization. It is very important that you and your family (including grandparents) or others caring for your newborn are up-to-date with the Tdap  or Td immunizations. The Tdap or Td immunization can help protect your newborn from getting ill.  Rubella immunization.  Varicella (chickenpox) immunization.  Influenza  immunization. You should receive this annual immunization if you did not receive the immunization during your pregnancy. Document Released: 03/03/2007 Document Revised: 01/29/2012 Document Reviewed: 01/01/2012 [facility] Patient Information 2014 [place], [place].   Postpartum Depression and Baby Blues  The postpartum period begins right after the birth of a baby. During this time, there is often a great amount of joy and excitement. It is also a time of considerable changes in the life of the parent(s). Regardless of how many times a mother gives birth, each child brings new challenges and dynamics to the family. It is not unusual to have feelings of excitement accompanied by confusing shifts in moods, emotions, and thoughts. All mothers are at risk of developing postpartum depression or the "baby blues." These mood changes can occur right after giving birth, or they may occur many months after giving birth. The baby blues or postpartum depression can be mild or severe. Additionally, postpartum depression can resolve rather quickly, or it can be a long-term condition. CAUSES Elevated hormones and their rapid decline are thought to be a main cause of postpartum depression and the baby blues. There are a number of hormones that radically change during and after pregnancy. Estrogen and progesterone usually decrease immediately after delivering your baby. The level of thyroid hormone and various cortisol steroids also rapidly drop. Other factors that play a major role in these changes include major life events and genetics.  RISK FACTORS If you have any of the following risks for the baby blues or postpartum depression, know what symptoms to watch out for during the postpartum period. Risk factors that may increase the likelihood of getting the baby blues or postpartum depression include: 1. Havinga personal or family history of depression. 2. Having depression while being pregnant. 3. Having premenstrual or  oral contraceptive-associated mood issues. 4. Having exceptional life stress. 5. Having marital conflict. 6. Lacking a social support network. 7. Having a baby with special needs. 8. Having health problems such as diabetes. SYMPTOMS Baby blues symptoms include:  Brief fluctuations in mood, such as going from extreme happiness to sadness.  Decreased concentration.  Difficulty sleeping.  Crying spells, tearfulness.  Irritability.  Anxiety. Postpartum depression symptoms typically begin within the first month after giving birth. These symptoms include:  Difficulty sleeping or excessive sleepiness.  Marked weight loss.  Agitation.  Feelings of worthlessness.  Lack of interest in activity or food. Postpartum psychosis is a very concerning condition and can be dangerous. Fortunately, it is rare. Displaying any of the following symptoms is cause for immediate medical attention. Postpartum psychosis symptoms include:  Hallucinations and delusions.  Bizarre or disorganized behavior.  Confusion or disorientation. DIAGNOSIS  A diagnosis is made by an evaluation of your symptoms. There are no medical or lab tests that lead to a diagnosis, but there are various questionnaires that a caregiver may use to identify those with the baby blues, postpartum depression, or psychosis. Often times, a screening tool called the [place] Postnatal Depression Scale is used to diagnose depression in the postpartum period.  TREATMENT The baby blues usually goes away on its own in 1 to 2 weeks. Social support is often all that is needed. You should be encouraged to get adequate sleep and rest. Occasionally, you may be given medicines to help you sleep.  Postpartum depression requires treatment as it can last several  months or longer if it is not treated. Treatment may include individual or group therapy, medicine, or both to address any social, physiological, and psychological factors that may play a  role in the depression. Regular exercise, a healthy diet, rest, and social support may also be strongly recommended.  Postpartum psychosis is more serious and needs treatment right away. Hospitalization is often needed. HOME CARE INSTRUCTIONS  Get as much rest as you can. Nap when the baby sleeps.  Exercise regularly. Some women find yoga and walking to be beneficial.  Eat a balanced and nourishing diet.  Do little things that you enjoy. Have a cup of tea, take a bubble bath, read your favorite magazine, or listen to your favorite music.  Avoid alcohol.  Ask for help with household chores, cooking, grocery shopping, or running errands as needed. Do not try to do everything.  Talk to people close to you about how you are feeling. Get support from your partner, family members, friends, or other new moms.  Try to stay positive in how you think. Think about the things you are grateful for.  Do not spend a lot of time alone.  Only take medicine as directed by your caregiver.  Keep all your postpartum appointments.  Let your caregiver know if you have any concerns. SEEK MEDICAL CARE IF: You are having a reaction or problems with your medicine. SEEK IMMEDIATE MEDICAL CARE IF:  You have suicidal feelings.  You feel you may harm the baby or someone else. Document Released: 02/08/2004 Document Revised: 07/29/2011 Document Reviewed: 03/12/2011 [facility] Patient Information 2014 [place], [place].     Breastfeeding Deciding to breastfeed is one of the best choices you can make for you and your baby. A change in hormones during pregnancy causes your breast tissue to grow and increases the number and size of your milk ducts. These hormones also allow proteins, sugars, and fats from your blood supply to make breast milk in your milk-producing glands. Hormones prevent breast milk from being released before your baby is born as well as prompt milk flow after birth. Once breastfeeding has begun,  thoughts of your baby, as well as his or her sucking or crying, can stimulate the release of milk from your milk-producing glands.  BENEFITS OF BREASTFEEDING For Your Baby  Your first milk (colostrum) helps your baby's digestive system function better.   There are antibodies in your milk that help your baby fight off infections.   Your baby has a lower incidence of asthma, allergies, and sudden infant death syndrome.   The nutrients in breast milk are better for your baby than infant formulas and are designed uniquely for your baby's needs.   Breast milk improves your baby's brain development.   Your baby is less likely to develop other conditions, such as childhood obesity, asthma, or type 2 diabetes mellitus.  For You   Breastfeeding helps to create a very special bond between you and your baby.   Breastfeeding is convenient. Breast milk is always available at the correct temperature and costs nothing.   Breastfeeding helps to burn calories and helps you lose the weight gained during pregnancy.   Breastfeeding makes your uterus contract to its prepregnancy size faster and slows bleeding (lochia) after you give birth.   Breastfeeding helps to lower your risk of developing type 2 diabetes mellitus, osteoporosis, and breast or ovarian cancer later in life. SIGNS THAT YOUR BABY IS HUNGRY Early Signs of Hunger  Increased alertness or activity.  Stretching.  Movement of the head from side to side.  Movement of the head and opening of the mouth when the corner of the mouth or cheek is stroked (rooting).  Increased sucking sounds, smacking lips, cooing, sighing, or squeaking.  Hand-to-mouth movements.  Increased sucking of fingers or hands. Late Signs of Hunger  Fussing.  Intermittent crying. Extreme Signs of Hunger Signs of extreme hunger will require calming and consoling before your baby will be able to breastfeed successfully. Do not wait for the following  signs of extreme hunger to occur before you initiate breastfeeding:   Restlessness.  A loud, strong cry.   Screaming.   BREASTFEEDING BASICS Breastfeeding Initiation  Find a comfortable place to sit or lie down, with your neck and back well supported.  Place a pillow or rolled up blanket under your baby to bring him or her to the level of your breast (if you are seated). Nursing pillows are specially designed to help support your arms and your baby while you breastfeed.  Make sure that your baby's abdomen is facing your abdomen.   Gently massage your breast. With your fingertips, massage from your chest wall toward your nipple in a circular motion. This encourages milk flow. You may need to continue this action during the feeding if your milk flows slowly.  Support your breast with 4 fingers underneath and your thumb above your nipple. Make sure your fingers are well away from your nipple and your baby's mouth.   Stroke your baby's lips gently with your finger or nipple.   When your baby's mouth is open wide enough, quickly bring your baby to your breast, placing your entire nipple and as much of the colored area around your nipple (areola) as possible into your baby's mouth.   More areola should be visible above your baby's upper lip than below the lower lip.   Your baby's tongue should be between his or her lower gum and your breast.   Ensure that your baby's mouth is correctly positioned around your nipple (latched). Your baby's lips should create a seal on your breast and be turned out (everted).  It is common for your baby to suck about 2-3 minutes in order to start the flow of breast milk. Latching Teaching your baby how to latch on to your breast properly is very important. An improper latch can cause nipple pain and decreased milk supply for you and poor weight gain in your baby. Also, if your baby is not latched onto your nipple properly, he or she may swallow some  air during feeding. This can make your baby fussy. Burping your baby when you switch breasts during the feeding can help to get rid of the air. However, teaching your baby to latch on properly is still the best way to prevent fussiness from swallowing air while breastfeeding. Signs that your baby has successfully latched on to your nipple:    Silent tugging or silent sucking, without causing you pain.   Swallowing heard between every 3-4 sucks.    Muscle movement above and in front of his or her ears while sucking.  Signs that your baby has not successfully latched on to nipple:   Sucking sounds or smacking sounds from your baby while breastfeeding.  Nipple pain. If you think your baby has not latched on correctly, slip your finger into the corner of your baby's mouth to break the suction and place it between your baby's gums. Attempt breastfeeding initiation again. Signs of Successful Breastfeeding Signs  from your baby:   A gradual decrease in the number of sucks or complete cessation of sucking.   Falling asleep.   Relaxation of his or her body.   Retention of a small amount of milk in his or her mouth.   Letting go of your breast by himself or herself. Signs from you:  Breasts that have increased in firmness, weight, and size 1-3 hours after feeding.   Breasts that are softer immediately after breastfeeding.  Increased milk volume, as well as a change in milk consistency and color by the fifth day of breastfeeding.   Nipples that are not sore, cracked, or bleeding. Signs That Your Pecola Leisure is Getting Enough Milk  Wetting at least 3 diapers in a 24-hour period. The urine should be clear and pale yellow by age 64 days.  At least 3 stools in a 24-hour period by age 64 days. The stool should be soft and yellow.  At least 3 stools in a 24-hour period by age 84 days. The stool should be seedy and yellow.  No loss of weight greater than 10% of birth weight during the first  84 days of age.  Average weight gain of 4-7 ounces (113-198 g) per week after age 19 days.  Consistent daily weight gain by age 64 days, without weight loss after the age of 2 weeks. After a feeding, your baby may spit up a small amount. This is common. BREASTFEEDING FREQUENCY AND DURATION Frequent feeding will help you make more milk and can prevent sore nipples and breast engorgement. Breastfeed when you feel the need to reduce the fullness of your breasts or when your baby shows signs of hunger. This is called "breastfeeding on demand." Avoid introducing a pacifier to your baby while you are working to establish breastfeeding (the first 4-6 weeks after your baby is born). After this time you may choose to use a pacifier. Research has shown that pacifier use during the first year of a baby's life decreases the risk of sudden infant death syndrome (SIDS). Allow your baby to feed on each breast as long as he or she wants. Breastfeed until your baby is finished feeding. When your baby unlatches or falls asleep while feeding from the first breast, offer the second breast. Because newborns are often sleepy in the first few weeks of life, you may need to awaken your baby to get him or her to feed. Breastfeeding times will vary from baby to baby. However, the following rules can serve as a guide to help you ensure that your baby is properly fed:  Newborns (babies 89 weeks of age or younger) may breastfeed every 1-3 hours.  Newborns should not go longer than 3 hours during the day or 5 hours during the night without breastfeeding.  You should breastfeed your baby a minimum of 8 times in a 24-hour period until you begin to introduce solid foods to your baby at around 51 months of age. BREAST MILK PUMPING Pumping and storing breast milk allows you to ensure that your baby is exclusively fed your breast milk, even at times when you are unable to breastfeed. This is especially important if you are going back to  work while you are still breastfeeding or when you are not able to be present during feedings. Your lactation consultant can give you guidelines on how long it is safe to store breast milk.  A breast pump is a machine that allows you to pump milk from your breast into a  sterile bottle. The pumped breast milk can then be stored in a refrigerator or freezer. Some breast pumps are operated by hand, while others use electricity. Ask your lactation consultant which type will work best for you. Breast pumps can be purchased, but some hospitals and breastfeeding support groups lease breast pumps on a monthly basis. A lactation consultant can teach you how to hand express breast milk, if you prefer not to use a pump.  CARING FOR YOUR BREASTS WHILE YOU BREASTFEED Nipples can become dry, cracked, and sore while breastfeeding. The following recommendations can help keep your breasts moisturized and healthy:  Avoid using soap on your nipples.   Wear a supportive bra. Although not required, special nursing bras and tank tops are designed to allow access to your breasts for breastfeeding without taking off your entire bra or top. Avoid wearing underwire-style bras or extremely tight bras.  Air dry your nipples for 3-59minutes after each feeding.   Use only cotton bra pads to absorb leaked breast milk. Leaking of breast milk between feedings is normal.   Use lanolin on your nipples after breastfeeding. Lanolin helps to maintain your skin's normal moisture barrier. If you use pure lanolin, you do not need to wash it off before feeding your baby again. Pure lanolin is not toxic to your baby. You may also hand express a few drops of breast milk and gently massage that milk into your nipples and allow the milk to air dry. In the first few weeks after giving birth, some women experience extremely full breasts (engorgement). Engorgement can make your breasts feel heavy, warm, and tender to the touch. Engorgement peaks  within 3-5 days after you give birth. The following recommendations can help ease engorgement:  Completely empty your breasts while breastfeeding or pumping. You may want to start by applying warm, moist heat (in the shower or with warm water-soaked hand towels) just before feeding or pumping. This increases circulation and helps the milk flow. If your baby does not completely empty your breasts while breastfeeding, pump any extra milk after he or she is finished.  Wear a snug bra (nursing or regular) or tank top for 1-2 days to signal your body to slightly decrease milk production.  Apply ice packs to your breasts, unless this is too uncomfortable for you.  Make sure that your baby is latched on and positioned properly while breastfeeding. If engorgement persists after 48 hours of following these recommendations, contact your health care provider or a Advertising copywriter. OVERALL HEALTH CARE RECOMMENDATIONS WHILE BREASTFEEDING  Eat healthy foods. Alternate between meals and snacks, eating 3 of each per day. Because what you eat affects your breast milk, some of the foods may make your baby more irritable than usual. Avoid eating these foods if you are sure that they are negatively affecting your baby.  Drink milk, fruit juice, and water to satisfy your thirst (about 10 glasses a day).   Rest often, relax, and continue to take your prenatal vitamins to prevent fatigue, stress, and anemia.  Continue breast self-awareness checks.  Avoid chewing and smoking tobacco.  Avoid alcohol and drug use. Some medicines that may be harmful to your baby can pass through breast milk. It is important to ask your health care provider before taking any medicine, including all over-the-counter and prescription medicine as well as vitamin and herbal supplements. It is possible to become pregnant while breastfeeding. If birth control is desired, ask your health care provider about options that will be safe  for  your baby. SEEK MEDICAL CARE IF:   You feel like you want to stop breastfeeding or have become frustrated with breastfeeding.  You have painful breasts or nipples.  Your nipples are cracked or bleeding.  Your breasts are red, tender, or warm.  You have a swollen area on either breast.  You have a fever or chills.  You have nausea or vomiting.  You have drainage other than breast milk from your nipples.  Your breasts do not become full before feedings by the fifth day after you give birth.  You feel sad and depressed.  Your baby is too sleepy to eat well.  Your baby is having trouble sleeping.   Your baby is wetting less than 3 diapers in a 24-hour period.  Your baby has less than 3 stools in a 24-hour period.  Your baby's skin or the white part of his or her eyes becomes yellow.   Your baby is not gaining weight by 32 days of age. SEEK IMMEDIATE MEDICAL CARE IF:   Your baby is overly tired (lethargic) and does not want to wake up and feed.  Your baby develops an unexplained fever. Document Released: 05/06/2005 Document Revised: 05/11/2013 Document Reviewed: 10/28/2012 [facility] Patient Information 2015 [place], [place]. This information is not intended to replace advice given to you by your health care provider. Make sure you discuss any questions you have with your health care provider.

## 2015-05-27 ENCOUNTER — Ambulatory Visit: Payer: Self-pay

## 2015-05-27 NOTE — Lactation Note (Signed)
This note was copied from the chart of Girl Taheerah Graver. Lactation Consultation Note  Patient Name: Girl Freddi CheMelanie Lincks ZOXWR'UToday's Date: 05/27/2015 Reason for consult: Follow-up assessment Infant is 4761 hours old and seen by lactation for follow-up assessment. Baby has had 9.3% wt loss. Mom reports she has been BF q 2-3 hours and pumping afterwards. Mom has been supplementing with her EBM via syringe through nipple shield while baby is BF. Mom reports she has been getting more milk recently when she pumps (15mL & 11mL the last 2 times). Mom was BF on left breast with nipple shield when LC entered but mom stated she had been BF for ~3625mins; when LC went to listen/ watch for swallows, baby had stopped suckling. Mom unlatched baby, tried to burp her & then latched her to her right breast in cross-cradle. Mom had been only offering 1 breast/ feeding. Baby suckled a few times but no swallows were noted (mom stated she had not been taught how to know when her infant swallows). Encouraged mom to ask for LC to come at next BF to assist with listening/ watching for swallows. Encouraged mom to continue BF on cue (at least every 3 hours), pump after & give all EBM at the next feeding. Encouraged mom to offer both breasts at every feed. Mom stated that she sometimes tries latching baby without nipple shield but finds that she latches better with it. Mom also using breast shells and/ or comfort gels between feedings. Mom reported no other questions at this time.  Maternal Data    Feeding Feeding Type: Breast Fed Length of feed: 35 min  LATCH Score/Interventions                      Lactation Tools Discussed/Used     Consult Status Consult Status: Follow-up Date: 05/28/15 Follow-up type: In-patient    Oneal GroutLaura C Sharhonda Atwood 05/27/2015, 1:38 PM

## 2015-05-28 ENCOUNTER — Ambulatory Visit: Payer: Self-pay

## 2015-05-28 NOTE — Lactation Note (Signed)
This note was copied from the chart of Girl Raenah Roupp. Lactation Consultation Note  Patient Name: Girl Freddi CheMelanie Zimmers ZOXWR'UToday's Date: 05/28/2015   Visited with Mom on day of discharge, baby 2584 hrs old.  Mom pumping when LC walked in room.  Mom obtained 30 ml, has pump at home.  Phototherapy discontinued and baby to follow up in am.  Mom states baby had just fed well for 30 mins, and used the nipple shield.  Milk noted in shield when baby came off the breast.  Recommended she continue offering about 30 ml following the breast due to jaundice and weight loss.  OP appointment made for 06/02/15 @ 4pm.  Encouraged skin to skin, and feeding at least every 3 hrs.  Mom to pump both breasts 15-20 minutes after feedings.  To call prn.       Consult Status Consult Status: Complete Date: 05/28/15 Follow-up type: Call as needed    Judee ClaraSmith, Yvonna Brun E 05/28/2015, 12:11 PM

## 2015-06-01 ENCOUNTER — Encounter: Payer: Medicaid Other | Admitting: Internal Medicine

## 2015-06-02 ENCOUNTER — Ambulatory Visit (HOSPITAL_COMMUNITY): Admission: RE | Admit: 2015-06-02 | Payer: Medicaid Other | Source: Ambulatory Visit

## 2015-06-02 ENCOUNTER — Ambulatory Visit (HOSPITAL_COMMUNITY)
Admission: RE | Admit: 2015-06-02 | Discharge: 2015-06-02 | Disposition: A | Discharge status: Home / Self Care | Payer: Medicaid Other | Source: Ambulatory Visit | Attending: Obstetrics and Gynecology | Admitting: Obstetrics and Gynecology

## 2015-06-02 NOTE — Lactation Note (Signed)
Lactation Consult; Reviewed basic latching with NS- encouraged to keep baby deep onto the breast. Limited tongue mobility- concerned about milk transfer. Encouraged mom to continue pumping to promote milk supply and feed all EBM back to baby, Suggested BFSG for weight check next week. No questions at present. To call prn  Mother's reason for visit:  Nipple shield followup Visit Type:  Feeding assessment Appointment Notes:  NS Consult:  Initial Lactation Consultant:  Allessandra Bernardi D  ___________________Pamelia Hoit_____________________________________________________ Baby's Name: Sylvia Holden Date of Birth: 05/24/2017 Pediatrician: Sol [facility]azingBragg Gender: female Gestational Age: 3876w6d (At Birth) Birth Weight: 6 lb 0.7 oz (2740 g) Weight at Discharge: Weight: 5 lb 8 oz (2495 g)Date of Discharge: 05/28/2015 Filed Weights   05/26/15 0109 05/27/15 0109 05/28/15 0000  Weight: 5 lb 12.1 oz (2610 g) 5 lb 7.7 oz (2485 g) 5 lb 8 oz (2495 g)     Weight today:5- 14.4  2668g   ________________________________________________________________________  Mother's Name: Sylvia Holden Type of delivery:  vag Breastfeeding Experience:  P1 Maternal Medical Conditions:  SVT Maternal Medications:  Beta blockers  ________________________________________________________________________  Breastfeeding History (Post Discharge)  Frequency of breastfeeding:  q 2 hours Duration of feeding:  20-40 min  Supplementation  Formula:  Volume 0 ml  Breastmilk:  Volume 30 ml Frequency:  q feeding  Method:  Syringe,   Pumping  Type of pump:  Medela pump in style Frequency:  q 2 hours Volume:  30 ml  Infant Intake and Output Assessment  Voids:  QS in 24 hrs.  Color:  Clear yellow Stools:  QS in 24 hrs.  Color:  Yellow  ________________________________________________________________________  Maternal Breast Assessment  Breast:  Filling Nipple:   Erect  _______________________________________________________________________ Feeding Assessment/Evaluation  Initial feeding assessment:  Infant's oral assessment:  Variance - limited tongue mobility Positioning:  Cradle and Football Left breast  LATCH documentation:  Latch:  1 = Repeated attempts needed to sustain latch, nipple held in mouth throughout feeding, stimulation needed to elicit sucking reflex.  Audible swallowing:  1 = A few with stimulation  Type of nipple:  2 = Everted at rest and after stimulation  Comfort (Breast/Nipple):  1 = Filling, red/small blisters or bruises, mild/mod discomfort  Hold (Positioning):  1 = Assistance needed to correctly position infant at breast and maintain latch  LATCH score:  6  Attached assessment:  Shallow  Lips flanged:  Yes.    Lips untucked:  Yes.    Suck assessment:  Displays both  Tools:  Nipple shield 20 mm Instructed on use and cleaning of tool:  Yes.    Pre-feed weight:  2668 g    5-14.1 Post-feed weight:  2680 g 5-14.6 Amount transferred:  12 ml Amount supplemented:   ml River nursed for 15 min. Attempted without NS but she kept sliding off. Used nipple shield cotinued sliding off. Reviewed with mom deep latch  Pre-feed weight:  2680 g5-14.6 Post-feed weight:  2688 g 5-14.8 Amount transferred:  8 ml Amount supplemented:  20 ml Nursed for 15 min continues on and off the breast. Mom has been using curved tip syringe to supplement after nursing. Used feeding tube/syringe-reviewed use and cleaning with parents   Total amount pumped post feed:  Nursed on both breasts- did not pump  Total amount transferred:  20 ml Total supplement given:  20 ml

## 2015-06-19 ENCOUNTER — Ambulatory Visit (INDEPENDENT_AMBULATORY_CARE_PROVIDER_SITE_OTHER): Payer: Medicaid Other | Admitting: Cardiovascular Disease

## 2015-06-19 ENCOUNTER — Encounter: Payer: Self-pay | Admitting: Cardiovascular Disease

## 2015-06-19 VITALS — BP 92/64 | HR 68 | Ht 68.0 in | Wt 152.1 lb

## 2015-06-19 DIAGNOSIS — I471 Supraventricular tachycardia, unspecified: Secondary | ICD-10-CM | POA: Insufficient documentation

## 2015-06-19 DIAGNOSIS — I4729 Other ventricular tachycardia: Secondary | ICD-10-CM

## 2015-06-19 DIAGNOSIS — I472 Ventricular tachycardia: Secondary | ICD-10-CM

## 2015-06-19 NOTE — Patient Instructions (Addendum)
Medication Instructions:  Your physician recommends that you continue on your current medications as directed. Please refer to the Current Medication list given to you today.   Labwork: None Ordered   Testing/Procedures: Your physician has requested that you have a cardiac MRI. Cardiac MRI uses a computer to create images of your heart as its beating, producing both still and moving pictures of your heart and major blood vessels. For further information please visit [email or web address]. Please follow the instruction sheet given to you today for more information.   Follow-Up: Your physician recommends that you schedule a follow-up appointment in: 3 months with Dr. Elease Hashimoto.    If you need a refill on your cardiac medications before your next appointment, please call your pharmacy.   Thank you for choosing [facility]! Eligha Bridegroom, RN [phone]

## 2015-06-19 NOTE — Progress Notes (Signed)
Cardiology Office Note   Date:  06/19/2015   ID:  Sylvia Holden, DOB [birth year], MRN [id number]  PCP:  No primary care provider on file.  Cardiologist:   Sylvia Holden, Sylvia Ping, MD   Chief Complaint  Patient presents with  . Follow-up    SVT vs. VT    problem list 1. 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 female who presents for follow up of her SVT vs. VT.  She was seen in the ER for syncope / tachycardia when she was [redacted] weeks pregnant. She converted to NSR with adenosine and then later with metoprolol.   She has been well controlled on PO metoprolol since that time .  [name]   Sylvia Holden is doing well.  Is on Metoprolol 37. 5 BID . Occasionally  Forgets a dose of metoprolol but this does not cause any episodes of tachycardia. Not as active yet.   Occasionally  walks the dogs Gets tired walking the dogs ,  No tacycardia with walking .    No past medical history on file.  Past Surgical History  Procedure Laterality Date  . Wisdom tooth extraction  2009  . Fracture surgery Left     arm   . Leep       Current Outpatient Prescriptions  Medication Sig Dispense Refill  . acetaminophen (TYLENOL) 325 MG tablet Take 2 tablets (650 mg total) by mouth every 4 (four) hours as needed for headache or mild pain.    Marland Kitchen ibuprofen (ADVIL,MOTRIN) 600 MG tablet Take 1 tablet (600 mg total) by mouth every 6 (six) hours. 30 tablet 0  . metoprolol tartrate 37.5 MG TABS Take 37.5 mg by mouth 2 (two) times daily. 30 tablet 0  . Prenatal Vit-Fe Fumarate-FA (PRENATAL MULTIVITAMIN) TABS tablet Take 1 tablet by mouth daily at 12 noon.    . senna-docusate (SENOKOT-S) 8.6-50 MG tablet Take 2 tablets by mouth at bedtime as needed for mild constipation. 30 tablet 1   No current facility-administered medications for this visit.    Allergies:   Review of patient's allergies indicates no known allergies.     Social History:  The patient  reports that she has never smoked. She does not have any smokeless tobacco history on file. She reports that she drinks alcohol. She reports that she does not use illicit drugs.   Family History:  The patient's family history includes Cancer in her maternal grandmother; Diabetes in her sister.    ROS:  Please see the history of present illness.    Review of Systems: Constitutional:  denies fever, chills, diaphoresis, appetite change and fatigue.  HEENT: denies photophobia, eye pain, redness, hearing loss, ear pain, congestion, sore throat, rhinorrhea, sneezing, neck pain, neck stiffness and tinnitus.  Respiratory: denies SOB, DOE, cough, chest tightness, and wheezing.  Cardiovascular: denies chest pain, palpitations and leg swelling.  Gastrointestinal: denies nausea, vomiting, abdominal pain, diarrhea, constipation, blood in stool.  Genitourinary: denies dysuria, urgency, frequency, hematuria, flank pain and difficulty urinating.  Musculoskeletal: denies  myalgias, back pain, joint swelling, arthralgias and gait problem.   Skin: denies pallor, rash and wound.  Neurological: denies dizziness, seizures, syncope, weakness, light-headedness, numbness and headaches.   Hematological: denies adenopathy, easy bruising, personal or family bleeding history.  Psychiatric/ Behavioral: denies suicidal ideation, mood changes, confusion, nervousness, sleep disturbance and agitation.       All other systems are reviewed and negative.  PHYSICAL EXAM: VS:  BP 92/64 mmHg  Pulse 68  Ht  (1.727 m)  Wt 152 lb 1.9 oz (69.001 kg)  BMI 23.14 kg/m2 , BMI Body mass index is 23.14 kg/(m^2). GEN: Well nourished, well developed, in no acute distress HEENT: normal Neck: no JVD, carotid bruits, or masses Cardiac: RRR; no murmurs, rubs, or gallops,no edema  Respiratory:  clear to auscultation bilaterally, normal work of breathing GI: soft, nontender, nondistended, +  BS MS: no deformity or atrophy Skin: warm and dry, no rash Neuro:  Strength and sensation are intact Psych: normal   EKG:  EKG is not ordered today.    Recent Labs: 05/18/2015: Magnesium 1.7; TSH 1.981 05/24/2015: ALT 14; BUN <5*; Creatinine, Ser 0.43*; Potassium 3.8; Sodium 139 05/26/2015: Hemoglobin 10.0*; Platelets 269    Lipid Panel No results found for: CHOL, TRIG, HDL, CHOLHDL, VLDL, LDLCALC, LDLDIRECT    Wt Readings from Last 3 Encounters:  06/19/15 152 lb 1.9 oz (69.001 kg)  05/23/15 177 lb (80.287 kg)  05/22/15 177 lb 11.2 oz (80.604 kg)      Other studies Reviewed: Additional studies/ records that were reviewed today include: . Review of the above records demonstrates:    ASSESSMENT AND PLAN:  1.  SVT vs. Tachycardia:   Sylvia Holden is doing very well. She's not had any further episodes of tachycardia. She's tolerating the metoprolol well  We discussed the possibility of doing a EP Study. We also discussed continued medical therapy. Will get a cardiac MRI for further evaluation of her cardiac status.  I'll see her in 3 months .   Current medicines are reviewed at length with the patient today.  The patient does not have concerns regarding medicines.  The following changes have been made:  no change  Labs/ tests ordered today include:  No orders of the defined types were placed in this encounter.     Disposition:   FU with me in 3 months .     Sylvia Holden, Sylvia Ping, MD  06/19/2015 11:18 AM    [facility] [address] [place], [place], [place]  [address] Phone: [phone]; Fax: [phone]   [facility]  [address] [place], [place]  [address] [phone]   Fax [phone]

## 2015-06-21 ENCOUNTER — Encounter: Payer: Self-pay | Admitting: Cardiovascular Disease

## 2015-06-28 ENCOUNTER — Encounter: Payer: Self-pay | Admitting: Cardiovascular Disease

## 2015-07-04 ENCOUNTER — Other Ambulatory Visit (HOSPITAL_COMMUNITY): Payer: Self-pay | Admitting: Obstetrics and Gynecology

## 2015-07-04 NOTE — H&P (Signed)
Sylvia Holden is a 25 y.o. female P: 1-0-0-1 who presents for dilatation and curettage because of retain products of conception.  The patient delivered vaginally on May 24, 2015 and sustained a second degree perineal and bilateral labial tear that was subsequently repaired at the time of delivery.   The patient was seen at [facility] on 06/29/15 because she had been bleeding with clots since delivery.  She changed her protection 4 times a day and experienced cramping that she rated 7/10 on a 10 point pain scale.  Relief from these cramps occurred when she would pass a clot and/or when she would take Ibuprofen 800 mg.  She denied any changes on bowel or bladder function, fever, or flank pain.  The patient does breastfeed however, every two hours for 20 minutes each breast.  A pelvic ultrasound done on 06/29/15 revealed a thickened heterogenous endometrium measuring 2.4 cm with vascularity consistent with retained products of conception.  The patient was placed on Methergine 0.2 mg for 4 doses and a repeat ultrasound on 07/03/14 did not show any significant change in the endometrial lining characteristic and the measurement was 2.0 cm.  The patient reported continual bleeding after the Methergine, though she did pass  a very large clot at one point.  At the time of follow up she was changing her protection every 4 hours with fewer clots and her cramping had diminished to 2/10 on a 10 point pain scale.  Given the patient's symptoms and ultrasound findings it was recommended that she proceed with dilatation and curettage for management of retained products of conception.  Past Medical History  OB History: G: 1;   P: 1-0-0-1   SVB 05/24/2015  GYN History: menarche: 11    LMP: post partum    Contracepton abstinence  The patient denies history of sexually transmitted disease.  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  Height: 5' 7.5 "   BMI: 22.7  Neck: supple without masses or thyromegaly Lungs: clear to auscultation Heart: regular rate and rhythm Abdomen: soft, non-tender and no organomegaly Pelvic:EGBUS- wnl; vagina sutures in place with no evidence of infection, moderated clotted blood,  uterus-normal size, cervix without lesions or motion tenderness; adnexae-no tenderness or masses Extremities:  no clubbing, cyanosis or edema   Assesment: Retained Products of Conception   Disposition:  A discussion was held with patient regarding the indication for her procedure(s) along with the risks, which include but are not limited to: reaction to anesthesia, damage to adjacent organs ( to inlcude perforation of uterus and endometrial scarring) , infection and excessive bleeding.  The patient verbalized understanding and has consented to proceed with Dilatation and Curettage at [facility] on July 05, 2015.   CSN# [id number]   Jeremyah Jelley J. Lowell Guitar, PA-C  for Dr. Janine Limbo

## 2015-07-05 ENCOUNTER — Other Ambulatory Visit: Payer: Self-pay | Admitting: Obstetrics and Gynecology

## 2015-07-05 ENCOUNTER — Ambulatory Visit (HOSPITAL_COMMUNITY): Payer: Medicaid Other | Admitting: Certified Registered Nurse Anesthetist

## 2015-07-05 ENCOUNTER — Ambulatory Visit (HOSPITAL_COMMUNITY)
Admission: RE | Admit: 2015-07-05 | Discharge: 2015-07-05 | Disposition: A | Discharge status: Home / Self Care | Payer: Medicaid Other | Source: Ambulatory Visit | Attending: Obstetrics and Gynecology | Admitting: Obstetrics and Gynecology

## 2015-07-05 ENCOUNTER — Encounter (HOSPITAL_COMMUNITY): Payer: Self-pay | Admitting: *Deleted

## 2015-07-05 ENCOUNTER — Encounter (HOSPITAL_COMMUNITY)
Admission: RE | Disposition: A | Discharge status: Home / Self Care | Payer: Self-pay | Source: Ambulatory Visit | Attending: Obstetrics and Gynecology

## 2015-07-05 DIAGNOSIS — Z87891 Personal history of nicotine dependence: Secondary | ICD-10-CM | POA: Diagnosis not present

## 2015-07-05 HISTORY — PX: DILATION AND EVACUATION: SHX1459

## 2015-07-05 HISTORY — DX: Cardiac arrhythmia, unspecified: I49.9

## 2015-07-05 LAB — CBC
HEMATOCRIT: 38.5 % (ref 36.0–46.0)
HEMOGLOBIN: 12.4 g/dL (ref 12.0–15.0)
MCH: 29.2 pg (ref 26.0–34.0)
MCHC: 32.2 g/dL (ref 30.0–36.0)
MCV: 90.8 fL (ref 78.0–100.0)
Platelets: 416 10*3/uL — ABNORMAL HIGH (ref 150–400)
RBC: 4.24 MIL/uL (ref 3.87–5.11)
RDW: 13.4 % (ref 11.5–15.5)
WBC: 9.4 10*3/uL (ref 4.0–10.5)

## 2015-07-05 SURGERY — DILATION AND EVACUATION, UTERUS
Anesthesia: General | Site: Vagina

## 2015-07-05 MED ORDER — ONDANSETRON HCL 4 MG/2ML IJ SOLN
INTRAMUSCULAR | Status: AC
  Filled 2015-07-05: qty 2

## 2015-07-05 MED ORDER — IBUPROFEN 800 MG PO TABS
800.0000 mg | ORAL_TABLET | Freq: Three times a day (TID) | ORAL | Status: DC | PRN
Start: 2015-07-05 — End: 2018-02-18

## 2015-07-05 MED ORDER — FENTANYL CITRATE (PF) 100 MCG/2ML IJ SOLN
INTRAMUSCULAR | Status: DC | PRN
  Administered 2015-07-05 (×2): 50 ug via INTRAVENOUS

## 2015-07-05 MED ORDER — LIDOCAINE HCL (CARDIAC) 20 MG/ML IV SOLN
INTRAVENOUS | Status: DC | PRN
  Administered 2015-07-05: 50 mg via INTRAVENOUS

## 2015-07-05 MED ORDER — CEFAZOLIN SODIUM-DEXTROSE 2-3 GM-% IV SOLR
2.0000 g | INTRAVENOUS | Status: AC
  Administered 2015-07-05: 2 g via INTRAVENOUS

## 2015-07-05 MED ORDER — DEXAMETHASONE SODIUM PHOSPHATE 10 MG/ML IJ SOLN
INTRAMUSCULAR | Status: DC | PRN
  Administered 2015-07-05: 4 mg via INTRAVENOUS

## 2015-07-05 MED ORDER — MIDAZOLAM HCL 2 MG/2ML IJ SOLN
INTRAMUSCULAR | Status: AC
  Filled 2015-07-05: qty 2

## 2015-07-05 MED ORDER — CEFAZOLIN SODIUM-DEXTROSE 2-3 GM-% IV SOLR
INTRAVENOUS | Status: AC
  Filled 2015-07-05: qty 50

## 2015-07-05 MED ORDER — FENTANYL CITRATE (PF) 100 MCG/2ML IJ SOLN
INTRAMUSCULAR | Status: AC
  Filled 2015-07-05: qty 2

## 2015-07-05 MED ORDER — BUPIVACAINE-EPINEPHRINE (PF) 0.5% -1:200000 IJ SOLN
INTRAMUSCULAR | Status: AC
  Filled 2015-07-05: qty 30

## 2015-07-05 MED ORDER — MIDAZOLAM HCL 2 MG/2ML IJ SOLN
INTRAMUSCULAR | Status: DC | PRN
Start: 2015-07-05 — End: 2015-07-05
  Administered 2015-07-05: 2 mg via INTRAVENOUS

## 2015-07-05 MED ORDER — PROPOFOL 10 MG/ML IV BOLUS
INTRAVENOUS | Status: AC
  Filled 2015-07-05: qty 20

## 2015-07-05 MED ORDER — ONDANSETRON HCL 4 MG/2ML IJ SOLN
INTRAMUSCULAR | Status: DC | PRN
  Administered 2015-07-05: 4 mg via INTRAVENOUS

## 2015-07-05 MED ORDER — BUPIVACAINE-EPINEPHRINE 0.5% -1:200000 IJ SOLN
INTRAMUSCULAR | Status: DC | PRN
  Administered 2015-07-05: 10 mL

## 2015-07-05 MED ORDER — OXYCODONE-ACETAMINOPHEN 5-325 MG PO TABS: 1.0000 | ORAL_TABLET | ORAL | Status: DC | PRN

## 2015-07-05 MED ORDER — PROPOFOL 10 MG/ML IV BOLUS
INTRAVENOUS | Status: DC | PRN
  Administered 2015-07-05: 200 mg via INTRAVENOUS

## 2015-07-05 MED ORDER — LACTATED RINGERS IV SOLN
INTRAVENOUS | Status: DC
  Administered 2015-07-05: 13:00:00 via INTRAVENOUS

## 2015-07-05 MED ORDER — DEXAMETHASONE SODIUM PHOSPHATE 4 MG/ML IJ SOLN
INTRAMUSCULAR | Status: AC
  Filled 2015-07-05: qty 1

## 2015-07-05 MED ORDER — KETOROLAC TROMETHAMINE 30 MG/ML IJ SOLN
INTRAMUSCULAR | Status: DC | PRN
  Administered 2015-07-05: 30 mg via INTRAVENOUS

## 2015-07-05 SURGICAL SUPPLY — 20 items
CATH ROBINSON RED A/P 16FR (CATHETERS) ×3 IMPLANT
CLOTH BEACON ORANGE TIMEOUT ST (SAFETY) ×3 IMPLANT
DECANTER SPIKE VIAL GLASS SM (MISCELLANEOUS) ×3 IMPLANT
GLOVE BIOGEL PI IND STRL 7.0 (GLOVE) ×1 IMPLANT
GLOVE BIOGEL PI IND STRL 8.5 (GLOVE) ×1 IMPLANT
GLOVE BIOGEL PI INDICATOR 7.0 (GLOVE) ×2
GLOVE BIOGEL PI INDICATOR 8.5 (GLOVE) ×2
GLOVE ECLIPSE 8.0 STRL XLNG CF (GLOVE) ×6 IMPLANT
GOWN STRL REUS W/TWL LRG LVL3 (GOWN DISPOSABLE) ×6 IMPLANT
KIT BERKELEY 1ST TRIMESTER 3/8 (MISCELLANEOUS) ×3 IMPLANT
NS IRRIG 1000ML POUR BTL (IV SOLUTION) ×3 IMPLANT
PACK VAGINAL MINOR WOMEN LF (CUSTOM PROCEDURE TRAY) ×3 IMPLANT
PAD OB MATERNITY 4.3X12.25 (PERSONAL CARE ITEMS) ×3 IMPLANT
PAD PREP 24X48 CUFFED NSTRL (MISCELLANEOUS) ×3 IMPLANT
SET BERKELEY SUCTION TUBING (SUCTIONS) ×3 IMPLANT
TOWEL OR 17X24 6PK STRL BLUE (TOWEL DISPOSABLE) ×6 IMPLANT
VACURETTE 10 RIGID CVD (CANNULA) IMPLANT
VACURETTE 7MM CVD STRL WRAP (CANNULA) IMPLANT
VACURETTE 8 RIGID CVD (CANNULA) IMPLANT
VACURETTE 9 RIGID CVD (CANNULA) IMPLANT

## 2015-07-05 NOTE — Op Note (Signed)
OPERATIVE NOTE  Sylvia Holden  DOB:    20-Oct-1990  MRN:    [id number]  CSN:    [id number]  Date of Surgery:  07/05/2015  Preoperative Diagnosis:  Retained placenta  Menorrhagia  Postoperative Diagnosis:  Same  Procedure:  SUCTION DILATATION AND EVACUATION/CURETTAGE  Surgeon:  Leonard Schwartz, M.D.  Assistant:  None  Anesthetic:  Monitor Anesthesia Care  Disposition:  The patient is a 25 y.o. year old female who presents with menorrhagia. She had a vaginal delivery in January 2017. An ultrasound showed what may be retained placenta and blood clots. She understands the indications for her surgical procedure as well as the alternative treatment options. She accepts the risk of, but not limited to, anesthetic complications, bleeding, infections, and possible damage to the surrounding organs.  Findings:  A small amount of products of conception were removed from within a 8 weeks size uterus. No adnexal masses were appreciated.  Procedure:  The patient was taken to the operating room where a general anesthetic was given. The patient's abdomen, perineum, and vagina were prepped with Betadine. The bladder was drained of urine. The patient was sterilely draped. Examination under anesthesia was performed. A paracervical block was placed using 10 cc of half percent Marcaine with epinephrine. The uterus sounded to 8 centimeters. The cervix was gently dilated. The uterine cavity was evacuated using a size 8 suction curet. The cavity was then cleaned using a medium sharp curet. The cavity was felt to be clean at the end of our procedure. The estimated blood loss was 25 cc. The uterus was reexamined and was noted to be firm. Hemostasis was adequate. Sponge and needle counts were correct. The patient tolerated her but her procedure well. She was awakened from her anesthetic without difficulty. She was transported to the recovery room in stable condition. The products of conception  were sent to pathology.  Followup instructions:  The patient return to see Dr. Stefano Gaul in 2 weeks. She was given prescriptions for pain medications. She was given instructions for patients who've undergone the surgical procedure. She will call for questions or concerns.  Leonard Schwartz, M.D.  07/05/2015 1:54 PM

## 2015-07-05 NOTE — Anesthesia Postprocedure Evaluation (Signed)
Anesthesia Post Note  Patient: Oluwatosin Coston  Procedure(s) Performed: Procedure(s) (LRB): DILATATION AND EVACUATION (N/A)  Patient location during evaluation: PACU Anesthesia Type: General Level of consciousness: awake Pain management: pain level controlled Vital Signs Assessment: post-procedure vital signs reviewed and stable Respiratory status: spontaneous breathing Cardiovascular status: stable Postop Assessment: no signs of nausea or vomiting Anesthetic complications: no    Last Vitals:  Filed Vitals:   07/05/15 1354 07/05/15 1400  BP: 123/88 100/87  Pulse: 80 64  Temp: 36.6 C   Resp: 14 21    Last Pain: There were no vitals filed for this visit.               Romonda Parker JR,JOHN Susann Givens

## 2015-07-05 NOTE — H&P (Signed)
Sylvia Holden is a 25 y.o. female P: 1-0-0-1 who presents for dilatation and curettage because of retain products of conception. The patient delivered vaginally on May 24, 2015 and sustained a second degree perineal and bilateral labial tear that was subsequently repaired at the time of delivery. The patient was seen at [facility] on 06/29/15 because she had been bleeding with clots since delivery. She changed her protection 4 times a day and experienced cramping that she rated 7/10 on a 10 point pain scale. Relief from these cramps occurred when she would pass a clot and/or when she would take Ibuprofen 800 mg. She denied any changes on bowel or bladder function, fever, or flank pain. The patient does breastfeed however, every two hours for 20 minutes each breast. A pelvic ultrasound done on 06/29/15 revealed a thickened heterogenous endometrium measuring 2.4 cm with vascularity consistent with retained products of conception. The patient was placed on Methergine 0.2 mg for 4 doses and a repeat ultrasound on 07/03/14 did not show any significant change in the endometrial lining characteristic and the measurement was 2.0 cm. The patient reported continual bleeding after the Methergine, though she did pass a very large clot at one point. At the time of follow up she was changing her protection every 4 hours with fewer clots and her cramping had diminished to 2/10 on a 10 point pain scale. Given the patient's symptoms and ultrasound findings it was recommended that she proceed with dilatation and curettage for management of retained products of conception.  Past Medical History  OB History: G: 1; P: 1-0-0-1 SVB 05/24/2015  GYN History: menarche: 11 LMP: post partum Contracepton abstinence The patient denies history of sexually transmitted disease. 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 Height: 5' 7.5 " BMI: 22.7  Neck: supple without masses or thyromegaly Lungs: clear to auscultation Heart: regular rate and rhythm Abdomen: soft, non-tender and no organomegaly Pelvic:EGBUS- wnl; vagina sutures in place with no evidence of infection, moderated clotted blood, uterus-normal size, cervix without lesions or motion tenderness; adnexae-no tenderness or masses Extremities: no clubbing, cyanosis or edema   Assesment: Retained Products of Conception   Disposition: A discussion was held with patient regarding the indication for her procedure(s) along with the risks, which include but are not limited to: reaction to anesthesia, damage to adjacent organs ( to inlcude perforation of uterus and endometrial scarring) , infection and excessive bleeding. The patient verbalized understanding and has consented to proceed with Dilatation and Curettage at [facility] on July 05, 2015.   CSN# [id number]   Sylvia J. Lowell Guitar, PA-C for Dr. Janine Limbo        Cosigned by: Kirkland Hun, MD at 07/04/2015 6:49 PM

## 2015-07-05 NOTE — Anesthesia Preprocedure Evaluation (Signed)
Anesthesia Evaluation  Patient identified by MRN, date of birth, ID band Patient awake    Reviewed: Allergy & Precautions, H&P , NPO status , Patient's Chart, lab work & pertinent test results, reviewed documented beta blocker date and time   Airway Mallampati: I  TM Distance: >3 FB Neck ROM: full    Dental no notable dental hx. (+) Teeth Intact   Pulmonary neg pulmonary ROS,    Pulmonary exam normal        Cardiovascular Normal cardiovascular exam+ dysrhythmias   SVT has been treated with adenosine   Neuro/Psych negative neurological ROS     GI/Hepatic negative GI ROS, Neg liver ROS,   Endo/Other  negative endocrine ROS  Renal/GU negative Renal ROS     Musculoskeletal   Abdominal Normal abdominal exam  (+)   Peds  Hematology negative hematology ROS (+)   Anesthesia Other Findings   Reproductive/Obstetrics negative OB ROS                             Anesthesia Physical Anesthesia Plan  ASA: II  Anesthesia Plan: General   Post-op Pain Management:    Induction: Intravenous  Airway Management Planned: LMA  Additional Equipment:   Intra-op Plan:   Post-operative Plan:   Informed Consent: I have reviewed the patients History and Physical, chart, labs and discussed the procedure including the risks, benefits and alternatives for the proposed anesthesia with the patient or authorized representative who has indicated his/her understanding and acceptance.     Plan Discussed with: CRNA and Surgeon  Anesthesia Plan Comments:         Anesthesia Quick Evaluation

## 2015-07-05 NOTE — Transfer of Care (Signed)
Immediate Anesthesia Transfer of Care Note  Patient: Sylvia Holden  Procedure(s) Performed: Procedure(s): DILATATION AND EVACUATION (N/A)  Patient Location: PACU  Anesthesia Type:General  Level of Consciousness: awake, alert  and oriented  Airway & Oxygen Therapy: Patient Spontanous Breathing and Patient connected to nasal cannula oxygen  Post-op Assessment: Report given to RN and Post -op Vital signs reviewed and stable  Post vital signs: Reviewed and stable  Last Vitals:  Filed Vitals:   07/05/15 1240  BP: 119/79  Pulse: 64  Temp: 36.7 C  Resp: 20    Complications: No apparent anesthesia complications

## 2015-07-05 NOTE — Discharge Instructions (Signed)
Dilation and Curettage or Vacuum Curettage   Dilation and curettage (D&C) and vacuum curettage are minor procedures. A D&C involves stretching (dilation) the cervix and scraping (curettage) the inside lining of the womb (uterus). During a D&C, tissue is gently scraped from the inside lining of the uterus. During a vacuum curettage, the lining and tissue in the uterus are removed with the use of gentle suction.  Curettage may be performed to either diagnose or treat a problem. As a diagnostic procedure, curettage is performed to examine tissues from the uterus. A diagnostic curettage may be performed for the following symptoms:   Irregular bleeding in the uterus.   Bleeding with the development of clots.   Spotting between menstrual periods.   Prolonged menstrual periods.   Bleeding after menopause.   No menstrual period (amenorrhea).   A change in size and shape of the uterus.  As a treatment procedure, curettage may be performed for the following reasons:   Removal of an IUD (intrauterine device).   Removal of retained placenta after giving birth. Retained placenta can cause an infection or bleeding severe enough to require transfusions.   Abortion.   Miscarriage.   Removal of polyps inside the uterus.   Removal of uncommon types of noncancerous lumps (fibroids).  [facility] CARE PROVIDER KNOW ABOUT:   Any allergies you have.   All medicines you are taking, including vitamins, herbs, eye drops, creams, and over-the-counter medicines.   Previous problems you or members of your family have had with the use of anesthetics.   Any blood disorders you have.   Previous surgeries you have had.   Medical conditions you have. RISKS AND COMPLICATIONS  Generally, this is a safe procedure. However, as with any procedure, complications can occur. Possible complications include:  Excessive bleeding.   Infection of the uterus.   Damage to the cervix.    Development of scar tissue (adhesions) inside the uterus, later causing abnormal amounts of menstrual bleeding.   Complications from the general anesthetic, if a general anesthetic is used.   Putting a hole (perforation) in the uterus. This is rare.  BEFORE THE PROCEDURE   Eat and drink before the procedure only as directed by your health care provider.   Arrange for someone to take you home.  PROCEDURE  This procedure usually takes about 15-30 minutes.  You will be given one of the following:  A medicine that numbs the area in and around the cervix (local anesthetic).   A medicine to make you sleep through the procedure (general anesthetic).  You will lie on your back with your legs in stirrups.   A warm metal or plastic instrument (speculum) will be placed in your vagina to keep it open and to allow the health care provider to see the cervix.  There are two ways in which your cervix can be softened and dilated. These include:   Taking a medicine.   Having thin rods (laminaria) inserted into your cervix.   A curved tool (curette) will be used to scrape cells from the inside lining of the uterus. In some cases, gentle suction is applied with the curette. The curette will then be removed.  AFTER THE PROCEDURE   You will rest in the recovery area until you are stable and are ready to go home.   You may feel sick to your stomach (nauseous) or throw up (vomit) if you were given a general anesthetic.   You may have a sore throat if  a tube was placed in your throat during general anesthesia.   You may have light cramping and bleeding. This may last for 2 days to 2 weeks after the procedure.   Your uterus needs to make a new lining after the procedure. This may make your next period late.   This information is not intended to replace advice given to you by your health care provider. Make sure you discuss any questions you have with your health care provider.    Document Released: 05/06/2005 Document Revised: 01/06/2013 Document Reviewed: 12/03/2012 Elsevier Interactive Patient Education 2016 [employer] DISCHARGE INSTRUCTIONS: D&C / D&E The following instructions have been prepared to help you care for yourself upon your return home.   Personal hygiene:  Use sanitary pads for vaginal drainage, not tampons.  Shower the day after your procedure.  NO tub baths, pools or Jacuzzis for 2-3 weeks.  Wipe front to back after using the bathroom.  Activity and limitations:  Do NOT drive or operate any equipment for 24 hours. The effects of anesthesia are still present and drowsiness may result.  Do NOT rest in bed all day.  Walking is encouraged.  Walk up and down stairs slowly.  You may resume your normal activity in one to two days or as indicated by your physician.  Sexual activity: NO intercourse for at least 2 weeks after the procedure, or as indicated by your physician.  Diet: Eat a light meal as desired this evening. You may resume your usual diet tomorrow.  Return to work: You may resume your work activities in one to two days or as indicated by your doctor.  What to expect after your surgery: Expect to have vaginal bleeding/discharge for 2-3 days and spotting for up to 10 days. It is not unusual to have soreness for up to 1-2 weeks. You may have a slight burning sensation when you urinate for the first day. Mild cramps may continue for a couple of days. You may have a regular period in 2-6 weeks.  Call your doctor for any of the following:  Excessive vaginal bleeding, saturating and changing one pad every hour.  Inability to urinate 6 hours after discharge from hospital.  Pain not relieved by pain medication.  Fever of 100.4 F or greater.  Unusual vaginal discharge or odor.   Call for an appointment:    Patients signature: ______________________  Nurses signature ________________________  Support person's  signature_______________________  No ibuprofen products until 8pm   Post Anesthesia Home Care Instructions  Activity: Get plenty of rest for the remainder of the day. A responsible adult should stay with you for 24 hours following the procedure.  For the next 24 hours, DO NOT: -Drive a car -Advertising copywriter -Drink alcoholic beverages -Take any medication unless instructed by your physician -Make any legal decisions or sign important papers.  Meals: Start with liquid foods such as gelatin or soup. Progress to regular foods as tolerated. Avoid greasy, spicy, heavy foods. If nausea and/or vomiting occur, drink only clear liquids until the nausea and/or vomiting subsides. Call your physician if vomiting continues.  Special Instructions/Symptoms: Your throat may feel dry or sore from the anesthesia or the breathing tube placed in your throat during surgery. If this causes discomfort, gargle with warm salt water. The discomfort should disappear within 24 hours.  If you had a scopolamine patch placed behind your ear for the management of post- operative nausea and/or vomiting:  1. The medication in the patch is effective for  72 hours, after which it should be removed.  Wrap patch in a tissue and discard in the trash. Wash hands thoroughly with soap and water. 2. You may remove the patch earlier than 72 hours if you experience unpleasant side effects which may include dry mouth, dizziness or visual disturbances. 3. Avoid touching the patch. Wash your hands with soap and water after contact with the patch.

## 2015-07-05 NOTE — Anesthesia Procedure Notes (Signed)
Procedure Name: LMA Insertion Date/Time: 07/05/2015 1:23 PM Performed by: Cleda Clarks Pre-anesthesia Checklist: Patient identified, Patient being monitored, Emergency Drugs available, Timeout performed and Suction available Patient Re-evaluated:Patient Re-evaluated prior to inductionOxygen Delivery Method: Circle system utilized Preoxygenation: Pre-oxygenation with 100% oxygen Intubation Type: IV induction Ventilation: Mask ventilation without difficulty LMA: LMA inserted LMA Size: 4.0 Number of attempts: 1 Placement Confirmation: positive ETCO2 and breath sounds checked- equal and bilateral Tube secured with: Tape Dental Injury: Teeth and Oropharynx as per pre-operative assessment

## 2015-07-05 NOTE — Progress Notes (Signed)
The patient was interviewed and examined today.  The previously documented history and physical examination was reviewed. There are no changes. The operative procedure was reviewed. The risks and benefits were outlined again. The specific risks include, but are not limited to, anesthetic complications, bleeding, infections, and possible damage to the surrounding organs. The patient's questions were answered.  We are ready to proceed as outlined. The likelihood of the patient achieving the goals of this procedure is very likely.   BP 119/79 mmHg  Pulse 64  Temp(Src) 98.1 F (36.7 C) (Oral)  Resp 20  Ht  (1.727 m)  Wt 147 lb (66.679 kg)  BMI 22.36 kg/m2  SpO2 100%  CBC    Component Value Date/Time   WBC 9.4 07/05/2015 1238   RBC 4.24 07/05/2015 1238   HGB 12.4 07/05/2015 1238   HCT 38.5 07/05/2015 1238   PLT 416* 07/05/2015 1238   MCV 90.8 07/05/2015 1238   MCH 29.2 07/05/2015 1238   MCHC 32.2 07/05/2015 1238   RDW 13.4 07/05/2015 1238   LYMPHSABS 3.3 05/18/2015 1650   MONOABS 1.7* 05/18/2015 1650   EOSABS 0.1 05/18/2015 1650   BASOSABS 0.0 05/18/2015 1650   Leonard Schwartz, M.D.

## 2015-07-06 ENCOUNTER — Encounter (HOSPITAL_COMMUNITY): Payer: Self-pay | Admitting: Obstetrics and Gynecology

## 2015-07-06 ENCOUNTER — Other Ambulatory Visit (HOSPITAL_COMMUNITY): Payer: Medicaid Other

## 2015-07-13 ENCOUNTER — Ambulatory Visit (HOSPITAL_COMMUNITY)
Admission: RE | Admit: 2015-07-13 | Discharge: 2015-07-13 | Disposition: A | Discharge status: Home / Self Care | Payer: Medicaid Other | Source: Ambulatory Visit | Attending: Cardiovascular Disease | Admitting: Cardiovascular Disease

## 2015-07-13 DIAGNOSIS — I472 Ventricular tachycardia: Secondary | ICD-10-CM | POA: Insufficient documentation

## 2015-07-13 DIAGNOSIS — I471 Supraventricular tachycardia: Secondary | ICD-10-CM | POA: Diagnosis not present

## 2015-07-13 DIAGNOSIS — I4729 Other ventricular tachycardia: Secondary | ICD-10-CM

## 2015-07-13 MED ORDER — GADOBENATE DIMEGLUMINE 529 MG/ML IV SOLN: 20.0000 mL | Freq: Once | INTRAVENOUS | Status: DC | PRN

## 2015-07-13 MED ORDER — GADOBENATE DIMEGLUMINE 529 MG/ML IV SOLN
20.0000 mL | Freq: Once | INTRAVENOUS | Status: AC | PRN
  Administered 2015-07-13: 20 mL via INTRAVENOUS

## 2015-09-04 ENCOUNTER — Ambulatory Visit (INDEPENDENT_AMBULATORY_CARE_PROVIDER_SITE_OTHER): Payer: Medicaid Other | Admitting: Cardiovascular Disease

## 2015-09-04 ENCOUNTER — Encounter: Payer: Self-pay | Admitting: Cardiovascular Disease

## 2015-09-04 VITALS — BP 88/60 | HR 70 | Ht 68.0 in | Wt 137.8 lb

## 2015-09-04 DIAGNOSIS — I471 Supraventricular tachycardia: Secondary | ICD-10-CM

## 2015-09-04 MED ORDER — METOPROLOL SUCCINATE ER 25 MG PO TB24: 25.0000 mg | ORAL_TABLET | Freq: Every day | ORAL | Status: DC

## 2015-09-04 NOTE — Progress Notes (Signed)
Cardiology Office Note   Date:  09/04/2015   ID:  Sylvia Holden, DOB November 17, 1990, MRN [id number]  PCP:  No PCP Per Patient  Cardiologist:   Vesta MixerNahser, Philip J, MD   Chief Complaint  Patient presents with  . Follow-up    SVT    problem list 1. 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 female who presents for follow up of her SVT vs. VT.  She was seen in the ER for syncope / tachycardia when she was [redacted] weeks pregnant. She converted to NSR with adenosine and then later with metoprolol.   She has been well controlled on PO metoprolol since that time .  [name]   Sylvia OrleansMelanie is doing well.  Is on Metoprolol 37. 5 BID . Occasionally  Forgets a dose of metoprolol but this does not cause any episodes of tachycardia. Not as active yet.   Occasionally  walks the dogs Gets tired walking the dogs ,  No tacycardia with walking .   September 04, 2015:  Sylvia OrleansMelanie is seen back today for follow up visit No further episodes of SVT  Exercises regularly . Is taking the metoprolol 37. 5 mg QD.   Feels fine Often forgets to take it   Past Medical History  Diagnosis Date  . Dysrhythmia     svt verus vtach     Past Surgical History  Procedure Laterality Date  . Wisdom tooth extraction  2009  . Fracture surgery Left     arm   . Leep    . Dilation and evacuation N/A 07/05/2015    Procedure: DILATATION AND EVACUATION;  Surgeon: Kirkland HunArthur Stringer, MD;  Location: [facility];  Service: Gynecology;  Laterality: N/A;     Current Outpatient Prescriptions  Medication Sig Dispense Refill  . acetaminophen (TYLENOL) 325 MG tablet Take 2 tablets (650 mg total) by mouth every 4 (four) hours as needed for headache or mild pain.    Marland Kitchen. ibuprofen (ADVIL,MOTRIN) 800 MG tablet Take 1 tablet (800 mg total) by mouth every 8 (eight) hours as needed. 50 tablet 1  . metoprolol tartrate 37.5 MG TABS Take 37.5 mg by mouth 2 (two) times  daily. (Patient taking differently: Take 37.5 mg by mouth daily. ) 30 tablet 0  . Prenatal Vit-Fe Fumarate-FA (PRENATAL MULTIVITAMIN) TABS tablet Take 1 tablet by mouth daily at 12 noon.    Marland Kitchen. oxyCODONE-acetaminophen (ROXICET) 5-325 MG tablet Take 1 tablet by mouth every 4 (four) hours as needed for severe pain. (Patient not taking: Reported on 09/04/2015) 10 tablet 0  . senna-docusate (SENOKOT-S) 8.6-50 MG tablet Take 2 tablets by mouth at bedtime as needed for mild constipation. (Patient not taking: Reported on 09/04/2015) 30 tablet 1   No current facility-administered medications for this visit.    Allergies:   Review of patient's allergies indicates no known allergies.    Social History:  The patient  reports that she has never smoked. She does not have any smokeless tobacco history on file. She reports that she drinks alcohol. She reports that she does not use illicit drugs.   Family History:  The patient's family history includes Cancer in her maternal grandmother; Diabetes in her sister.    ROS:  Please see the history of present illness.    Review of Systems: Constitutional:  denies fever, chills, diaphoresis, appetite change and fatigue.  HEENT: denies photophobia, eye pain, redness, hearing loss, ear pain, congestion,  sore throat, rhinorrhea, sneezing, neck pain, neck stiffness and tinnitus.  Respiratory: denies SOB, DOE, cough, chest tightness, and wheezing.  Cardiovascular: denies chest pain, palpitations and leg swelling.  Gastrointestinal: denies nausea, vomiting, abdominal pain, diarrhea, constipation, blood in stool.  Genitourinary: denies dysuria, urgency, frequency, hematuria, flank pain and difficulty urinating.  Musculoskeletal: denies  myalgias, back pain, joint swelling, arthralgias and gait problem.   Skin: denies pallor, rash and wound.  Neurological: denies dizziness, seizures, syncope, weakness, light-headedness, numbness and headaches.   Hematological: denies  adenopathy, easy bruising, personal or family bleeding history.  Psychiatric/ Behavioral: denies suicidal ideation, mood changes, confusion, nervousness, sleep disturbance and agitation.       All other systems are reviewed and negative.    PHYSICAL EXAM: VS:  BP 88/60 mmHg  Pulse 70  Ht  (1.727 m)  Wt 137 lb 12.8 oz (62.506 kg)  BMI 20.96 kg/m2 , BMI Body mass index is 20.96 kg/(m^2). GEN: Well nourished, well developed, in no acute distress HEENT: normal Neck: no JVD, carotid bruits, or masses Cardiac: RRR; no murmurs, rubs, or gallops,no edema  Respiratory:  clear to auscultation bilaterally, normal work of breathing GI: soft, nontender, nondistended, + BS MS: no deformity or atrophy Skin: warm and dry, no rash Neuro:  Strength and sensation are intact Psych: normal   EKG:  EKG is not ordered today.    Recent Labs: 05/18/2015: Magnesium 1.7; TSH 1.981 05/24/2015: ALT 14; BUN <5*; Creatinine, Ser 0.43*; Potassium 3.8; Sodium 139 07/05/2015: Hemoglobin 12.4; Platelets 416*    Lipid Panel No results found for: CHOL, TRIG, HDL, CHOLHDL, VLDL, LDLCALC, LDLDIRECT    Wt Readings from Last 3 Encounters:  09/04/15 137 lb 12.8 oz (62.506 kg)  07/05/15 147 lb (66.679 kg)  06/19/15 152 lb 1.9 oz (69.001 kg)      Other studies Reviewed: Additional studies/ records that were reviewed today include: . Review of the above records demonstrates:    ASSESSMENT AND PLAN:  1.  SVT vs. Tachycardia:   Ece s doing very well. We will decrease her  to Toprol-XL 25 mg a day. I'll see her in 6 month..   Current medicines are reviewed at length with the patient today.  The patient does not have concerns regarding medicines.  The following changes have been made:  no change  Labs/ tests ordered today include:  No orders of the defined types were placed in this encounter.     Disposition:   FU with me in 6 months .     Nahser, Deloris Ping, MD  09/04/2015 3:31 PM    [facility] [address] [place], [place], [place]  [address] Phone: [phone]; Fax: [phone]   [facility]  [address] [place], [place]  [address] [phone]   Fax [phone]

## 2015-09-04 NOTE — Patient Instructions (Addendum)
Medication Instructions:  STOP Metoprolol Tartrate (Lopressor) START Metoprolol Succinate (Toprol XL) 25 mg once daily   Labwork: None Ordered   Testing/Procedures: None Ordered   Follow-Up: Your physician wants you to follow-up in: 6 months with  Dr. Elease HashimotoNahser.  You will receive a reminder letter in the mail two months in advance. If you don't receive a letter, please call our office to schedule the follow-up appointment.   If you need a refill on your cardiac medications before your next appointment, please call your pharmacy.   Thank you for choosing [facility]! Eligha BridegroomMichelle Ladonte Verstraete, RN [phone]

## 2017-09-04 ENCOUNTER — Other Ambulatory Visit: Payer: Self-pay | Admitting: Obstetrics and Gynecology

## 2018-02-17 ENCOUNTER — Encounter: Payer: Self-pay | Admitting: Cardiovascular Disease

## 2018-02-18 ENCOUNTER — Ambulatory Visit: Payer: BLUE CROSS/BLUE SHIELD | Admitting: Cardiovascular Disease

## 2018-02-18 ENCOUNTER — Encounter (INDEPENDENT_AMBULATORY_CARE_PROVIDER_SITE_OTHER): Payer: Self-pay

## 2018-02-18 ENCOUNTER — Encounter: Payer: Self-pay | Admitting: Cardiovascular Disease

## 2018-02-18 VITALS — BP 140/76 | HR 83 | Ht 68.0 in | Wt 126.0 lb

## 2018-02-18 DIAGNOSIS — I471 Supraventricular tachycardia: Secondary | ICD-10-CM

## 2018-02-18 MED ORDER — METOPROLOL SUCCINATE ER 25 MG PO TB24: 25.0000 mg | ORAL_TABLET | Freq: Every day | ORAL | 3 refills | Status: DC

## 2018-02-18 MED ORDER — PROPRANOLOL HCL 10 MG PO TABS: 10.0000 mg | ORAL_TABLET | Freq: Four times a day (QID) | ORAL | 11 refills | Status: AC | PRN

## 2018-02-18 NOTE — Patient Instructions (Signed)
Medication Instructions:  Your physician has recommended you make the following change in your medication:   START Metoprolol (Toprol XL) 25 mg once daily START Propranolol 10 mg up to 4 times per day as needed for fast heart rate/palpitations   Labwork: None Ordered   Testing/Procedures: None Ordered   Follow-Up: Your physician wants you to follow-up in: 1 year with Dr. Elease Hashimoto. You will receive a reminder letter in the mail two months in advance. If you don't receive a letter, please call our office to schedule the follow-up appointment.   If you need a refill on your cardiac medications before your next appointment, please call your pharmacy.   Thank you for choosing [facility]! Eligha Bridegroom, RN [phone]

## 2018-02-18 NOTE — Progress Notes (Signed)
Cardiology Office Note   Date:  02/18/2018   ID:  Sylvia Holden, DOB 02-Sep-1990, MRN [id number]  PCP:  Patient, No Pcp Per  Cardiologist:   Kristeen Miss, MD   Chief Complaint  Patient presents with  . Tachycardia    problem list 1. 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 female who presents for follow up of her SVT vs. VT.  She was seen in the ER for syncope / tachycardia when she was [redacted] weeks pregnant. She converted to NSR with adenosine and then later with metoprolol.   She has been well controlled on PO metoprolol since that time .  [name]   Sylvia Holden is doing well.  Is on Metoprolol 37. 5 BID . Occasionally  Forgets a dose of metoprolol but this does not cause any episodes of tachycardia. Not as active yet.   Occasionally  walks the dogs Gets tired walking the dogs ,  No tacycardia with walking .   September 04, 2015:  Sylvia Holden is seen back today for follow up visit No further episodes of SVT  Exercises regularly . Is taking the metoprolol 37. 5 mg QD.   Feels fine Often forgets to take   Oct. 2, 2019: Has been doing well. Lost her insurance , now has a job, Is engaged and is moving to [place].       Is a hair stylist -    Getting some exercise, waks ,  No CP or dyspnea.    Is having some episodes of tachycardia Resolves with valsalva .   Last only for a few seconds  Has mlld weakness with the episodes of Has stopped all her meds     Past Medical History:  Diagnosis Date  . Dysrhythmia    svt verus vtach     Past Surgical History:  Procedure Laterality Date  . DILATION AND EVACUATION N/A 07/05/2015   Procedure: DILATATION AND EVACUATION;  Surgeon: Kirkland Hun, MD;  Location: [facility];  Service: Gynecology;  Laterality: N/A;  . FRACTURE SURGERY Left    arm   . LEEP    . WISDOM TOOTH EXTRACTION  2009     No current outpatient medications on file.   No current facility-administered  medications for this visit.     Allergies:   Patient has no known allergies.    Social History:  The patient  reports that she has never smoked. She has never used smokeless tobacco. She reports that she drinks alcohol. She reports that she does not use drugs.   Family History:  The patient's family history includes Cancer in her maternal grandmother; Diabetes in her sister.    ROS:  Please see the history of present illness.      Physical Exam: Blood pressure 140/76, pulse 83, height 5\' 8"  (1.727 m), weight 126 lb (57.2 kg), SpO2 99 %, currently breastfeeding.  GEN:  Young female,  NAD  HEENT: Normal NECK: No JVD; No carotid bruits LYMPHATICS: No lymphadenopathy CARDIAC: RRR , no murmurs, rubs, gallops RESPIRATORY:  Clear to auscultation without rales, wheezing or rhonchi  ABDOMEN: Soft, non-tender, non-distended MUSCULOSKELETAL:  No edema; No deformity  SKIN: Warm and dry NEUROLOGIC:  Alert and oriented x 3   EKG: February 18, 2018: Normal sinus rhythm at 83.  Nonspecific ST abnormality..    Recent Labs: No results found for requested labs within last 8760 hours.    Lipid Panel No results found for:  CHOL, TRIG, HDL, CHOLHDL, VLDL, LDLCALC, LDLDIRECT    Wt Readings from Last 3 Encounters:  02/18/18 126 lb (57.2 kg)  09/04/15 137 lb 12.8 oz (62.5 kg)  07/05/15 147 lb (66.7 kg)      Other studies Reviewed: Additional studies/ records that were reviewed today include: . Review of the above records demonstrates:    ASSESSMENT AND PLAN:  1.  SVT vs. Tachycardia:   Sylvia Holden has stopped her Toprol-XL.  She is having episodes of SVT about once a week.  She has remembered to do the Valsalva maneuver and typically is able to convert these to sinus rhythm fairly quickly.  She is not sure that she wants to take a medication every day but will go ahead and give her a prescription for Toprol-XL.  She states that she will try to take it and see how she does.  We will also  give her propranolol 10 mg tablets to take 4 times a day as needed.  She will continue to use the Valsalva maneuver and also stimulation of the diving reflex.  I will see her again in 1 year.  Long-term we discussed the benefits of SVT ablation.  I told her that we would not want to do this while she still having children..  Current medicines are reviewed at length with the patient today.  The patient does not have concerns regarding medicines.  The following changes have been made:  no change  Labs/ tests ordered today include:  No orders of the defined types were placed in this encounter.    Disposition:   FU with me in 1 year      Kristeen Miss, MD  02/18/2018 1:55 PM    [facility] [address] [place], [place], [place]  [address] Phone: [phone]; Fax: [phone]

## 2019-03-12 ENCOUNTER — Other Ambulatory Visit: Payer: Self-pay | Admitting: Cardiovascular Disease

## 2019-04-13 ENCOUNTER — Other Ambulatory Visit: Payer: Self-pay | Admitting: Cardiovascular Disease

## 2019-05-03 ENCOUNTER — Other Ambulatory Visit: Payer: Self-pay | Admitting: Cardiovascular Disease

## 2019-05-26 ENCOUNTER — Other Ambulatory Visit: Payer: Self-pay
